# Patient Record
Sex: Male | Born: 1945 | Race: White | Hispanic: No | Marital: Married | Smoking: Former smoker
Health system: Southern US, Community
[De-identification: ages and names within clinical notes are randomized; demographics above are authoritative.]

## PROBLEM LIST (undated history)

## (undated) DIAGNOSIS — M19039 Primary osteoarthritis, unspecified wrist: Secondary | ICD-10-CM

## (undated) DIAGNOSIS — G8929 Other chronic pain: Secondary | ICD-10-CM

## (undated) DIAGNOSIS — D649 Anemia, unspecified: Secondary | ICD-10-CM

## (undated) DIAGNOSIS — F32A Depression, unspecified: Secondary | ICD-10-CM

## (undated) DIAGNOSIS — IMO0002 Reserved for concepts with insufficient information to code with codable children: Secondary | ICD-10-CM

## (undated) DIAGNOSIS — E785 Hyperlipidemia, unspecified: Secondary | ICD-10-CM

## (undated) DIAGNOSIS — N200 Calculus of kidney: Secondary | ICD-10-CM

## (undated) DIAGNOSIS — E669 Obesity, unspecified: Secondary | ICD-10-CM

## (undated) DIAGNOSIS — M549 Dorsalgia, unspecified: Secondary | ICD-10-CM

## (undated) DIAGNOSIS — G4730 Sleep apnea, unspecified: Secondary | ICD-10-CM

## (undated) DIAGNOSIS — D369 Benign neoplasm, unspecified site: Secondary | ICD-10-CM

## (undated) DIAGNOSIS — K648 Other hemorrhoids: Secondary | ICD-10-CM

## (undated) DIAGNOSIS — E079 Disorder of thyroid, unspecified: Secondary | ICD-10-CM

## (undated) DIAGNOSIS — F329 Major depressive disorder, single episode, unspecified: Secondary | ICD-10-CM

## (undated) DIAGNOSIS — K922 Gastrointestinal hemorrhage, unspecified: Secondary | ICD-10-CM

## (undated) HISTORY — DX: Benign neoplasm, unspecified site: D36.9

## (undated) HISTORY — DX: Major depressive disorder, single episode, unspecified: F32.9

## (undated) HISTORY — DX: Other hemorrhoids: K64.8

## (undated) HISTORY — DX: Reserved for concepts with insufficient information to code with codable children: IMO0002

## (undated) HISTORY — DX: Primary osteoarthritis, unspecified wrist: M19.039

## (undated) HISTORY — PX: ABDOMINAL SURGERY: SHX537

## (undated) HISTORY — DX: Anemia, unspecified: D64.9

## (undated) HISTORY — PX: GASTRIC BYPASS: SHX52

## (undated) HISTORY — DX: Obesity, unspecified: E66.9

## (undated) HISTORY — DX: Depression, unspecified: F32.A

## (undated) HISTORY — PX: ROTATOR CUFF REPAIR: SHX139

---

## 2005-05-27 ENCOUNTER — Observation Stay (HOSPITAL_COMMUNITY): Admission: RE | Admit: 2005-05-27 | Discharge: 2005-05-28 | Payer: Self-pay | Admitting: Specialist

## 2011-11-28 DIAGNOSIS — M48061 Spinal stenosis, lumbar region without neurogenic claudication: Secondary | ICD-10-CM | POA: Diagnosis not present

## 2011-12-14 DIAGNOSIS — K591 Functional diarrhea: Secondary | ICD-10-CM | POA: Diagnosis not present

## 2011-12-15 DIAGNOSIS — R109 Unspecified abdominal pain: Secondary | ICD-10-CM | POA: Diagnosis not present

## 2011-12-15 DIAGNOSIS — I959 Hypotension, unspecified: Secondary | ICD-10-CM | POA: Diagnosis not present

## 2011-12-15 DIAGNOSIS — K284 Chronic or unspecified gastrojejunal ulcer with hemorrhage: Secondary | ICD-10-CM | POA: Diagnosis not present

## 2011-12-15 DIAGNOSIS — K921 Melena: Secondary | ICD-10-CM | POA: Diagnosis not present

## 2011-12-15 DIAGNOSIS — K922 Gastrointestinal hemorrhage, unspecified: Secondary | ICD-10-CM | POA: Diagnosis not present

## 2011-12-15 DIAGNOSIS — M109 Gout, unspecified: Secondary | ICD-10-CM | POA: Diagnosis not present

## 2011-12-15 DIAGNOSIS — E785 Hyperlipidemia, unspecified: Secondary | ICD-10-CM | POA: Diagnosis not present

## 2011-12-15 DIAGNOSIS — D72829 Elevated white blood cell count, unspecified: Secondary | ICD-10-CM | POA: Diagnosis not present

## 2011-12-15 DIAGNOSIS — D62 Acute posthemorrhagic anemia: Secondary | ICD-10-CM | POA: Diagnosis present

## 2011-12-15 DIAGNOSIS — Z79899 Other long term (current) drug therapy: Secondary | ICD-10-CM | POA: Diagnosis not present

## 2011-12-15 DIAGNOSIS — K253 Acute gastric ulcer without hemorrhage or perforation: Secondary | ICD-10-CM | POA: Diagnosis not present

## 2011-12-15 DIAGNOSIS — D510 Vitamin B12 deficiency anemia due to intrinsic factor deficiency: Secondary | ICD-10-CM | POA: Diagnosis not present

## 2011-12-15 DIAGNOSIS — IMO0001 Reserved for inherently not codable concepts without codable children: Secondary | ICD-10-CM | POA: Diagnosis not present

## 2011-12-15 DIAGNOSIS — E669 Obesity, unspecified: Secondary | ICD-10-CM | POA: Diagnosis not present

## 2011-12-15 DIAGNOSIS — D500 Iron deficiency anemia secondary to blood loss (chronic): Secondary | ICD-10-CM | POA: Diagnosis not present

## 2011-12-15 DIAGNOSIS — E119 Type 2 diabetes mellitus without complications: Secondary | ICD-10-CM | POA: Diagnosis not present

## 2011-12-15 DIAGNOSIS — K279 Peptic ulcer, site unspecified, unspecified as acute or chronic, without hemorrhage or perforation: Secondary | ICD-10-CM | POA: Diagnosis not present

## 2011-12-15 DIAGNOSIS — R5383 Other fatigue: Secondary | ICD-10-CM | POA: Diagnosis not present

## 2011-12-15 DIAGNOSIS — R609 Edema, unspecified: Secondary | ICD-10-CM | POA: Diagnosis present

## 2011-12-15 DIAGNOSIS — D649 Anemia, unspecified: Secondary | ICD-10-CM | POA: Diagnosis not present

## 2011-12-15 DIAGNOSIS — I951 Orthostatic hypotension: Secondary | ICD-10-CM | POA: Diagnosis not present

## 2011-12-15 DIAGNOSIS — K289 Gastrojejunal ulcer, unspecified as acute or chronic, without hemorrhage or perforation: Secondary | ICD-10-CM | POA: Diagnosis not present

## 2011-12-15 DIAGNOSIS — R031 Nonspecific low blood-pressure reading: Secondary | ICD-10-CM | POA: Diagnosis not present

## 2011-12-15 DIAGNOSIS — R5381 Other malaise: Secondary | ICD-10-CM | POA: Diagnosis not present

## 2011-12-15 DIAGNOSIS — R578 Other shock: Secondary | ICD-10-CM | POA: Diagnosis not present

## 2011-12-15 DIAGNOSIS — E039 Hypothyroidism, unspecified: Secondary | ICD-10-CM | POA: Diagnosis not present

## 2011-12-15 DIAGNOSIS — R55 Syncope and collapse: Secondary | ICD-10-CM | POA: Diagnosis not present

## 2011-12-15 DIAGNOSIS — E871 Hypo-osmolality and hyponatremia: Secondary | ICD-10-CM | POA: Diagnosis not present

## 2011-12-15 DIAGNOSIS — R404 Transient alteration of awareness: Secondary | ICD-10-CM | POA: Diagnosis not present

## 2011-12-15 DIAGNOSIS — G8929 Other chronic pain: Secondary | ICD-10-CM | POA: Diagnosis not present

## 2011-12-15 DIAGNOSIS — K2901 Acute gastritis with bleeding: Secondary | ICD-10-CM | POA: Diagnosis not present

## 2011-12-15 DIAGNOSIS — Z9884 Bariatric surgery status: Secondary | ICD-10-CM | POA: Diagnosis not present

## 2011-12-21 DIAGNOSIS — D649 Anemia, unspecified: Secondary | ICD-10-CM | POA: Diagnosis not present

## 2011-12-21 DIAGNOSIS — K922 Gastrointestinal hemorrhage, unspecified: Secondary | ICD-10-CM | POA: Diagnosis not present

## 2011-12-21 DIAGNOSIS — R5381 Other malaise: Secondary | ICD-10-CM | POA: Diagnosis not present

## 2011-12-22 DIAGNOSIS — D62 Acute posthemorrhagic anemia: Secondary | ICD-10-CM | POA: Diagnosis not present

## 2011-12-22 DIAGNOSIS — R031 Nonspecific low blood-pressure reading: Secondary | ICD-10-CM | POA: Diagnosis not present

## 2011-12-22 DIAGNOSIS — K253 Acute gastric ulcer without hemorrhage or perforation: Secondary | ICD-10-CM | POA: Diagnosis not present

## 2011-12-22 DIAGNOSIS — K922 Gastrointestinal hemorrhage, unspecified: Secondary | ICD-10-CM | POA: Diagnosis not present

## 2011-12-22 DIAGNOSIS — E119 Type 2 diabetes mellitus without complications: Secondary | ICD-10-CM | POA: Diagnosis not present

## 2011-12-22 DIAGNOSIS — D649 Anemia, unspecified: Secondary | ICD-10-CM | POA: Diagnosis not present

## 2011-12-23 DIAGNOSIS — E785 Hyperlipidemia, unspecified: Secondary | ICD-10-CM | POA: Diagnosis not present

## 2011-12-23 DIAGNOSIS — D72829 Elevated white blood cell count, unspecified: Secondary | ICD-10-CM | POA: Diagnosis not present

## 2011-12-23 DIAGNOSIS — D510 Vitamin B12 deficiency anemia due to intrinsic factor deficiency: Secondary | ICD-10-CM | POA: Diagnosis not present

## 2011-12-23 DIAGNOSIS — E119 Type 2 diabetes mellitus without complications: Secondary | ICD-10-CM | POA: Diagnosis not present

## 2011-12-23 DIAGNOSIS — K648 Other hemorrhoids: Secondary | ICD-10-CM

## 2011-12-23 DIAGNOSIS — E871 Hypo-osmolality and hyponatremia: Secondary | ICD-10-CM | POA: Diagnosis not present

## 2011-12-23 DIAGNOSIS — R55 Syncope and collapse: Secondary | ICD-10-CM | POA: Diagnosis not present

## 2011-12-23 DIAGNOSIS — I959 Hypotension, unspecified: Secondary | ICD-10-CM | POA: Diagnosis not present

## 2011-12-23 DIAGNOSIS — K289 Gastrojejunal ulcer, unspecified as acute or chronic, without hemorrhage or perforation: Secondary | ICD-10-CM | POA: Diagnosis not present

## 2011-12-23 DIAGNOSIS — Z9884 Bariatric surgery status: Secondary | ICD-10-CM | POA: Diagnosis not present

## 2011-12-23 DIAGNOSIS — E039 Hypothyroidism, unspecified: Secondary | ICD-10-CM | POA: Diagnosis not present

## 2011-12-23 DIAGNOSIS — K922 Gastrointestinal hemorrhage, unspecified: Secondary | ICD-10-CM | POA: Diagnosis not present

## 2011-12-23 DIAGNOSIS — K921 Melena: Secondary | ICD-10-CM | POA: Diagnosis not present

## 2011-12-23 DIAGNOSIS — K284 Chronic or unspecified gastrojejunal ulcer with hemorrhage: Secondary | ICD-10-CM | POA: Diagnosis not present

## 2011-12-23 DIAGNOSIS — Z6831 Body mass index (BMI) 31.0-31.9, adult: Secondary | ICD-10-CM | POA: Diagnosis not present

## 2011-12-23 DIAGNOSIS — G8929 Other chronic pain: Secondary | ICD-10-CM | POA: Diagnosis present

## 2011-12-23 DIAGNOSIS — Z5189 Encounter for other specified aftercare: Secondary | ICD-10-CM | POA: Diagnosis not present

## 2011-12-23 DIAGNOSIS — M8440XA Pathological fracture, unspecified site, initial encounter for fracture: Secondary | ICD-10-CM | POA: Diagnosis not present

## 2011-12-23 DIAGNOSIS — F329 Major depressive disorder, single episode, unspecified: Secondary | ICD-10-CM | POA: Diagnosis not present

## 2011-12-23 DIAGNOSIS — E876 Hypokalemia: Secondary | ICD-10-CM | POA: Diagnosis not present

## 2011-12-23 DIAGNOSIS — Z85850 Personal history of malignant neoplasm of thyroid: Secondary | ICD-10-CM | POA: Diagnosis not present

## 2011-12-23 DIAGNOSIS — M109 Gout, unspecified: Secondary | ICD-10-CM | POA: Diagnosis not present

## 2011-12-23 DIAGNOSIS — D500 Iron deficiency anemia secondary to blood loss (chronic): Secondary | ICD-10-CM | POA: Diagnosis not present

## 2011-12-23 DIAGNOSIS — Z980 Intestinal bypass and anastomosis status: Secondary | ICD-10-CM | POA: Diagnosis not present

## 2011-12-23 DIAGNOSIS — D369 Benign neoplasm, unspecified site: Secondary | ICD-10-CM

## 2011-12-23 DIAGNOSIS — K279 Peptic ulcer, site unspecified, unspecified as acute or chronic, without hemorrhage or perforation: Secondary | ICD-10-CM | POA: Diagnosis not present

## 2011-12-23 DIAGNOSIS — D62 Acute posthemorrhagic anemia: Secondary | ICD-10-CM | POA: Diagnosis not present

## 2011-12-23 DIAGNOSIS — E1159 Type 2 diabetes mellitus with other circulatory complications: Secondary | ICD-10-CM | POA: Diagnosis not present

## 2011-12-23 DIAGNOSIS — K280 Acute gastrojejunal ulcer with hemorrhage: Secondary | ICD-10-CM | POA: Diagnosis not present

## 2011-12-23 HISTORY — DX: Benign neoplasm, unspecified site: D36.9

## 2011-12-23 HISTORY — DX: Other hemorrhoids: K64.8

## 2011-12-27 DIAGNOSIS — E785 Hyperlipidemia, unspecified: Secondary | ICD-10-CM | POA: Diagnosis present

## 2011-12-27 DIAGNOSIS — E119 Type 2 diabetes mellitus without complications: Secondary | ICD-10-CM | POA: Diagnosis not present

## 2011-12-27 DIAGNOSIS — Z6830 Body mass index (BMI) 30.0-30.9, adult: Secondary | ICD-10-CM | POA: Diagnosis not present

## 2011-12-27 DIAGNOSIS — D649 Anemia, unspecified: Secondary | ICD-10-CM | POA: Diagnosis not present

## 2011-12-27 DIAGNOSIS — E039 Hypothyroidism, unspecified: Secondary | ICD-10-CM | POA: Diagnosis not present

## 2011-12-27 DIAGNOSIS — Z791 Long term (current) use of non-steroidal anti-inflammatories (NSAID): Secondary | ICD-10-CM | POA: Diagnosis not present

## 2011-12-27 DIAGNOSIS — D62 Acute posthemorrhagic anemia: Secondary | ICD-10-CM | POA: Diagnosis present

## 2011-12-27 DIAGNOSIS — R5381 Other malaise: Secondary | ICD-10-CM | POA: Diagnosis present

## 2011-12-27 DIAGNOSIS — M109 Gout, unspecified: Secondary | ICD-10-CM | POA: Diagnosis not present

## 2011-12-27 DIAGNOSIS — E871 Hypo-osmolality and hyponatremia: Secondary | ICD-10-CM | POA: Diagnosis present

## 2011-12-27 DIAGNOSIS — E538 Deficiency of other specified B group vitamins: Secondary | ICD-10-CM | POA: Diagnosis present

## 2011-12-27 DIAGNOSIS — Z5189 Encounter for other specified aftercare: Secondary | ICD-10-CM | POA: Diagnosis not present

## 2011-12-27 DIAGNOSIS — E890 Postprocedural hypothyroidism: Secondary | ICD-10-CM | POA: Diagnosis present

## 2011-12-27 DIAGNOSIS — E669 Obesity, unspecified: Secondary | ICD-10-CM | POA: Diagnosis present

## 2011-12-27 DIAGNOSIS — R404 Transient alteration of awareness: Secondary | ICD-10-CM | POA: Diagnosis not present

## 2011-12-27 DIAGNOSIS — R578 Other shock: Secondary | ICD-10-CM | POA: Diagnosis not present

## 2011-12-27 DIAGNOSIS — M8448XA Pathological fracture, other site, initial encounter for fracture: Secondary | ICD-10-CM | POA: Diagnosis present

## 2011-12-27 DIAGNOSIS — I10 Essential (primary) hypertension: Secondary | ICD-10-CM | POA: Diagnosis not present

## 2011-12-27 DIAGNOSIS — F329 Major depressive disorder, single episode, unspecified: Secondary | ICD-10-CM | POA: Diagnosis not present

## 2011-12-27 DIAGNOSIS — K284 Chronic or unspecified gastrojejunal ulcer with hemorrhage: Secondary | ICD-10-CM | POA: Diagnosis present

## 2011-12-27 DIAGNOSIS — E079 Disorder of thyroid, unspecified: Secondary | ICD-10-CM | POA: Diagnosis not present

## 2011-12-27 DIAGNOSIS — K922 Gastrointestinal hemorrhage, unspecified: Secondary | ICD-10-CM | POA: Diagnosis not present

## 2011-12-27 DIAGNOSIS — R000 Tachycardia, unspecified: Secondary | ICD-10-CM | POA: Diagnosis present

## 2011-12-27 DIAGNOSIS — R42 Dizziness and giddiness: Secondary | ICD-10-CM | POA: Diagnosis not present

## 2011-12-27 DIAGNOSIS — Z9884 Bariatric surgery status: Secondary | ICD-10-CM | POA: Diagnosis not present

## 2011-12-27 DIAGNOSIS — Z85850 Personal history of malignant neoplasm of thyroid: Secondary | ICD-10-CM | POA: Diagnosis not present

## 2011-12-27 DIAGNOSIS — Z79899 Other long term (current) drug therapy: Secondary | ICD-10-CM | POA: Diagnosis not present

## 2011-12-27 DIAGNOSIS — Z87442 Personal history of urinary calculi: Secondary | ICD-10-CM | POA: Diagnosis not present

## 2011-12-27 DIAGNOSIS — E8809 Other disorders of plasma-protein metabolism, not elsewhere classified: Secondary | ICD-10-CM | POA: Diagnosis present

## 2011-12-27 DIAGNOSIS — G8929 Other chronic pain: Secondary | ICD-10-CM | POA: Diagnosis present

## 2011-12-28 ENCOUNTER — Inpatient Hospital Stay (HOSPITAL_COMMUNITY)
Admission: EM | Admit: 2011-12-28 | Discharge: 2011-12-30 | DRG: 377 | Disposition: A | Discharge status: Other Acute Inpatient Hospital | Payer: Medicare Other | Attending: Family Medicine | Admitting: Family Medicine

## 2011-12-28 ENCOUNTER — Encounter (HOSPITAL_COMMUNITY): Payer: Self-pay | Admitting: Emergency Medicine

## 2011-12-28 ENCOUNTER — Other Ambulatory Visit: Payer: Self-pay

## 2011-12-28 DIAGNOSIS — E871 Hypo-osmolality and hyponatremia: Secondary | ICD-10-CM | POA: Diagnosis present

## 2011-12-28 DIAGNOSIS — R42 Dizziness and giddiness: Secondary | ICD-10-CM | POA: Diagnosis not present

## 2011-12-28 DIAGNOSIS — E8809 Other disorders of plasma-protein metabolism, not elsewhere classified: Secondary | ICD-10-CM | POA: Diagnosis present

## 2011-12-28 DIAGNOSIS — K283 Acute gastrojejunal ulcer without hemorrhage or perforation: Secondary | ICD-10-CM

## 2011-12-28 DIAGNOSIS — R578 Other shock: Secondary | ICD-10-CM | POA: Diagnosis not present

## 2011-12-28 DIAGNOSIS — T394X5A Adverse effect of antirheumatics, not elsewhere classified, initial encounter: Secondary | ICD-10-CM | POA: Diagnosis present

## 2011-12-28 DIAGNOSIS — D649 Anemia, unspecified: Secondary | ICD-10-CM | POA: Diagnosis not present

## 2011-12-28 DIAGNOSIS — Z87442 Personal history of urinary calculi: Secondary | ICD-10-CM | POA: Diagnosis not present

## 2011-12-28 DIAGNOSIS — Z5189 Encounter for other specified aftercare: Secondary | ICD-10-CM | POA: Diagnosis not present

## 2011-12-28 DIAGNOSIS — R5381 Other malaise: Secondary | ICD-10-CM | POA: Diagnosis present

## 2011-12-28 DIAGNOSIS — Z79899 Other long term (current) drug therapy: Secondary | ICD-10-CM | POA: Diagnosis not present

## 2011-12-28 DIAGNOSIS — G8929 Other chronic pain: Secondary | ICD-10-CM | POA: Diagnosis present

## 2011-12-28 DIAGNOSIS — Z791 Long term (current) use of non-steroidal anti-inflammatories (NSAID): Secondary | ICD-10-CM | POA: Diagnosis not present

## 2011-12-28 DIAGNOSIS — K284 Chronic or unspecified gastrojejunal ulcer with hemorrhage: Secondary | ICD-10-CM | POA: Diagnosis present

## 2011-12-28 DIAGNOSIS — K289 Gastrojejunal ulcer, unspecified as acute or chronic, without hemorrhage or perforation: Secondary | ICD-10-CM | POA: Diagnosis not present

## 2011-12-28 DIAGNOSIS — Z9884 Bariatric surgery status: Secondary | ICD-10-CM

## 2011-12-28 DIAGNOSIS — Z6830 Body mass index (BMI) 30.0-30.9, adult: Secondary | ICD-10-CM

## 2011-12-28 DIAGNOSIS — E079 Disorder of thyroid, unspecified: Secondary | ICD-10-CM | POA: Diagnosis present

## 2011-12-28 DIAGNOSIS — E890 Postprocedural hypothyroidism: Secondary | ICD-10-CM | POA: Diagnosis present

## 2011-12-28 DIAGNOSIS — E785 Hyperlipidemia, unspecified: Secondary | ICD-10-CM | POA: Diagnosis present

## 2011-12-28 DIAGNOSIS — E119 Type 2 diabetes mellitus without complications: Secondary | ICD-10-CM | POA: Diagnosis not present

## 2011-12-28 DIAGNOSIS — I10 Essential (primary) hypertension: Secondary | ICD-10-CM | POA: Diagnosis not present

## 2011-12-28 DIAGNOSIS — M8448XA Pathological fracture, other site, initial encounter for fracture: Secondary | ICD-10-CM | POA: Diagnosis present

## 2011-12-28 DIAGNOSIS — E538 Deficiency of other specified B group vitamins: Secondary | ICD-10-CM | POA: Diagnosis present

## 2011-12-28 DIAGNOSIS — R000 Tachycardia, unspecified: Secondary | ICD-10-CM | POA: Diagnosis present

## 2011-12-28 DIAGNOSIS — F329 Major depressive disorder, single episode, unspecified: Secondary | ICD-10-CM | POA: Diagnosis not present

## 2011-12-28 DIAGNOSIS — E669 Obesity, unspecified: Secondary | ICD-10-CM | POA: Diagnosis present

## 2011-12-28 DIAGNOSIS — Z85850 Personal history of malignant neoplasm of thyroid: Secondary | ICD-10-CM | POA: Diagnosis not present

## 2011-12-28 DIAGNOSIS — R404 Transient alteration of awareness: Secondary | ICD-10-CM | POA: Diagnosis not present

## 2011-12-28 DIAGNOSIS — K922 Gastrointestinal hemorrhage, unspecified: Secondary | ICD-10-CM | POA: Diagnosis present

## 2011-12-28 DIAGNOSIS — D62 Acute posthemorrhagic anemia: Secondary | ICD-10-CM | POA: Diagnosis present

## 2011-12-28 DIAGNOSIS — E039 Hypothyroidism, unspecified: Secondary | ICD-10-CM | POA: Diagnosis not present

## 2011-12-28 HISTORY — DX: Hyperlipidemia, unspecified: E78.5

## 2011-12-28 HISTORY — DX: Dorsalgia, unspecified: M54.9

## 2011-12-28 HISTORY — DX: Sleep apnea, unspecified: G47.30

## 2011-12-28 HISTORY — DX: Calculus of kidney: N20.0

## 2011-12-28 HISTORY — DX: Disorder of thyroid, unspecified: E07.9

## 2011-12-28 HISTORY — DX: Other chronic pain: G89.29

## 2011-12-28 HISTORY — DX: Gastrointestinal hemorrhage, unspecified: K92.2

## 2011-12-28 LAB — CBC
Hemoglobin: 8 g/dL — ABNORMAL LOW (ref 13.0–17.0)
MCH: 30.2 pg (ref 26.0–34.0)
MCV: 85.3 fL (ref 78.0–100.0)
RBC: 2.65 MIL/uL — ABNORMAL LOW (ref 4.22–5.81)
RDW: 14.6 % (ref 11.5–15.5)

## 2011-12-28 LAB — DIFFERENTIAL
Eosinophils Relative: 1 % (ref 0–5)
Lymphocytes Relative: 16 % (ref 12–46)
Monocytes Absolute: 0.8 10*3/uL (ref 0.1–1.0)
Neutro Abs: 7.6 10*3/uL (ref 1.7–7.7)
Neutrophils Relative %: 75 % (ref 43–77)

## 2011-12-28 LAB — COMPREHENSIVE METABOLIC PANEL
AST: 19 U/L (ref 0–37)
Calcium: 8.2 mg/dL — ABNORMAL LOW (ref 8.4–10.5)
Chloride: 101 mEq/L (ref 96–112)
Creatinine, Ser: 0.98 mg/dL (ref 0.50–1.35)
GFR calc Af Amer: >90 mL/min (ref >90)
GFR calc non Af Amer: 84 mL/min — ABNORMAL LOW (ref >90)
Glucose, Bld: 160 mg/dL — ABNORMAL HIGH (ref 70–99)
Sodium: 132 mEq/L — ABNORMAL LOW (ref 135–145)
Total Bilirubin: 0.2 mg/dL — ABNORMAL LOW (ref 0.3–1.2)

## 2011-12-28 LAB — PROTIME-INR: INR: 1.02 (ref 0.00–1.49)

## 2011-12-28 LAB — APTT: aPTT: 27 seconds (ref 24–37)

## 2011-12-28 LAB — PREPARE RBC (CROSSMATCH)

## 2011-12-28 MED ORDER — SODIUM CHLORIDE 0.9 % IV SOLN
INTRAVENOUS | Status: DC
  Administered 2011-12-28: 18:00:00 via INTRAVENOUS

## 2011-12-28 MED ORDER — ONDANSETRON HCL 4 MG/2ML IJ SOLN: 4.0000 mg | Freq: Three times a day (TID) | INTRAMUSCULAR | Status: AC | PRN

## 2011-12-28 MED ORDER — SODIUM CHLORIDE 0.9 % IV SOLN
INTRAVENOUS | Status: AC
  Administered 2011-12-28: 23:00:00 via INTRAVENOUS

## 2011-12-28 MED ORDER — PANTOPRAZOLE SODIUM 40 MG IV SOLR
40.0000 mg | Freq: Once | INTRAVENOUS | Status: AC
  Administered 2011-12-28: 40 mg via INTRAVENOUS
  Filled 2011-12-28: qty 40

## 2011-12-28 MED ORDER — CYANOCOBALAMIN 1000 MCG/ML IJ SOLN
1000.0000 ug | Freq: Once | INTRAMUSCULAR | Status: AC
  Administered 2011-12-29: 1000 ug via INTRAMUSCULAR
  Filled 2011-12-28: qty 1

## 2011-12-28 MED ORDER — SODIUM CHLORIDE 0.9 % IV SOLN
8.0000 mg/h | INTRAVENOUS | Status: DC
  Administered 2011-12-28 – 2011-12-30 (×4): 8 mg/h via INTRAVENOUS
  Filled 2011-12-28 (×11): qty 80

## 2011-12-28 NOTE — ED Notes (Signed)
2 unsuccessful attempts at an IV start.

## 2011-12-28 NOTE — ED Notes (Signed)
Pt transported to floor by [employer]

## 2011-12-28 NOTE — H&P (Signed)
PCP:   No primary provider on file.  Doesn't have one per pt  Chief Complaint:  Dizziness, orthostasis  HPI: 65yoM with h/o remote thyroid ca s/p thyroidectomy, s/p gastric bypass ~2005, and chronic back  pain now presents with 3rd admission this month for UGIB and acute anemia, with prior EGD's  showing ulcers near gastric bypass anastamoses.   Pt and wife are quite accurate historians who state he was in usual state of health, including  chronic back pain for which he saw a pain clinic. He didn't want to take opioids, so instead  started taking Aleve, unclear dosage, but at least BID for the two weeks preceding 12/15/2011,  when he woke up 3a and had hemetemasis, bright red blood, without pain or promontory symptoms.   He was then admitted for the next week to [facility]. He had EGD x2 at [facility] (also  given Protonix, blood transfusions), and eventually discharged with Hgb 10.3. At home after  discharge, not 10 hrs later he had a vagal episode and syncopized, sent back to [place]. Then on  2/1 transferred to [facility] where Hgb eventually dropped to 8.3, given 1u PRBC's. On Sunday  they did an argon cauterization during EGD and monitored him, where Hgb stabilized at 9-10 by  discharge on Tuesday 2/5. He was transferred to [facility] where his wife works for short stay  rehab. They state he's had "20 blood transfusions" since all this started.   At [facility] last night, BP's started dropping to the 95's SBP, but they just monitored. He  was also orthostatic through the day Wednesday, but no LOC. Around 2p today also had a dark,  tarry looking stool but no hematemesis.   In the ED vital were stable, although currently on the low side 90-100's. Labs showed minimal  hypoNa 132, renal 27/0.98, low albumin 2.4, Hgb 8.0, normal plts and INR. Crossed for 1u PRBC's  and ordered for transfusion.   ROS as above o/w negative for chest pain, SOB, abd pain. He strongly denies  any alcohol and  states he quit smoking 30 yrs ago. There is no abd pain with all this. ROS o/w negative.   Past Medical History  Diagnosis Date  . Diabetes mellitus   . Thyroid disease     s/p thyroidectomy for thyroid cancer in 1971  . Hyperlipemia   . Nephrolithiasis   . Chronic back pain     compression fractures, 1 thoracic, 2 lumbar  . Upper GI bleed     Complicated course 11/2011 with EGD x3 showing ulcers at margins of gastric bypass    Past Surgical History  Procedure Date  . Gastric bypass   . Abdominal surgery     panniculectomy   . Rotator cuff repair     bilateral    Medications:  HOME MEDS: Reconciled  Prior to Admission medications   Medication Sig Start Date End Date Taking? Authorizing Provider  allopurinol (ZYLOPRIM) 150 mg TABS Take 150 mg by mouth daily.   Yes Historical Provider, MD  calcium-vitamin D (OSCAL WITH D) 500-200 MG-UNIT per tablet Take 1 tablet by mouth daily.   Yes Historical Provider, MD  CYANOCOBALAMIN IJ Inject 1,000 mcg/mL as directed every 30 (thirty) days.   Yes Historical Provider, MD  DULoxetine (CYMBALTA) 30 MG capsule Take 60 mg by mouth daily.    Yes Historical Provider, MD  Furosemide (LASIX PO) Take 5 mg by mouth daily.   Yes Historical Provider, MD  levothyroxine (  SYNTHROID, LEVOTHROID) 112 MCG tablet Take 162 mcg by mouth daily.    Yes Historical Provider, MD  metFORMIN (GLUCOPHAGE) 500 MG tablet Take 500 mg by mouth 2 (two) times daily with a meal.   Yes Historical Provider, MD  omeprazole (PRILOSEC) 40 MG capsule Take 40 mg by mouth 2 (two) times daily.   Yes Historical Provider, MD  potassium chloride (K-DUR) 10 MEQ tablet Take 10 mEq by mouth daily.   Yes Historical Provider, MD  pravastatin (PRAVACHOL) 20 MG tablet Take 20 mg by mouth daily.   Yes Historical Provider, MD  Prenatal Vit-Fe Fumarate-FA (MULTIVITAMIN-PRENATAL) 27-0.8 MG TABS Take 1 tablet by mouth daily.   Yes Historical Provider, MD  traMADol (ULTRAM) 50 MG  tablet Take 50 mg by mouth every 6 (six) hours as needed. pain   Yes Historical Provider, MD    Allergies:  No Known Allergies  Social History:   reports that he has quit smoking. He has never used smokeless tobacco. He reports that he does not drink alcohol or use illicit drugs. Wife Jasmine December [phone] works at [employer]. Pt still quite active before this recent hospitalization.   Family History: History reviewed. No pertinent family history.  Physical Exam: Filed Vitals:   12/28/11 1546 12/28/11 1615 12/28/11 1639 12/28/11 1959  BP:  111/56  103/62  Pulse:  95  91  Temp:   98.5 F (36.9 C) 99.1 F (37.3 C)  TempSrc:    Oral  Resp:  17  16  SpO2: 100% 100%  99%   Blood pressure 103/62, pulse 91, temperature 99.1 F (37.3 C), temperature source Oral, resp. rate 16, SpO2 99.00%.  Gen: Middle aged, overall healthy appearing M in no distress, pleasant, quite specific historian,  appears overall well.  HEENT: Pupils round and reactive, has small ecchymosis near eye, sclera/irises normal appearing.  Mouth monderately dry, but normal appearing Neck: Normal appearing but with anterior neck surgical scar Lungs: CTAB no w/c/r, normal exam, good air movement Heart: Regular, not tachycardic, no m/g, normal exam Abd: Soft, non tender, non distended, no facial grimacing to palpation, normal BS's, normal exam  overall Extrem: Warm hands, cool but not cold feet which are also quite pale. Palpable radials and DP's.  No BLE edema Neuro: Alert, attentive, CN2-12 intact, no facial droop or slurred speech, moves extremities on  own. Grossly non-focal Skin: Scattered bruises on his arms from prior blood draws   Labs & Imaging Results for orders placed during the hospital encounter of 12/28/11 (from the past 48 hour(s))  CBC     Status: Abnormal   Collection Time   12/28/11  4:55 PM      Component Value Range Comment   WBC 10.0  4.0 - 10.5 (K/uL)    RBC 2.65 (*) 4.22 - 5.81 (MIL/uL)     Hemoglobin 8.0 (*) 13.0 - 17.0 (g/dL)    HCT 86.5 (*) 78.4 - 52.0 (%)    MCV 85.3  78.0 - 100.0 (fL)    MCH 30.2  26.0 - 34.0 (pg)    MCHC 35.4  30.0 - 36.0 (g/dL)    RDW 69.6  29.5 - 28.4 (%)    Platelets 239  150 - 400 (K/uL)   DIFFERENTIAL     Status: Normal   Collection Time   12/28/11  4:55 PM      Component Value Range Comment   Neutrophils Relative 75  43 - 77 (%)    Neutro Abs 7.6  1.7 - 7.7 (K/uL)    Lymphocytes Relative 16  12 - 46 (%)    Lymphs Abs 1.6  0.7 - 4.0 (K/uL)    Monocytes Relative 8  3 - 12 (%)    Monocytes Absolute 0.8  0.1 - 1.0 (K/uL)    Eosinophils Relative 1  0 - 5 (%)    Eosinophils Absolute 0.1  0.0 - 0.7 (K/uL)    Basophils Relative 0  0 - 1 (%)    Basophils Absolute 0.0  0.0 - 0.1 (K/uL)   COMPREHENSIVE METABOLIC PANEL     Status: Abnormal   Collection Time   12/28/11  4:55 PM      Component Value Range Comment   Sodium 132 (*) 135 - 145 (mEq/L)    Potassium 4.1  3.5 - 5.1 (mEq/L)    Chloride 101  96 - 112 (mEq/L)    CO2 24  19 - 32 (mEq/L)    Glucose, Bld 160 (*) 70 - 99 (mg/dL)    BUN 27 (*) 6 - 23 (mg/dL)    Creatinine, Ser 1.61  0.50 - 1.35 (mg/dL)    Calcium 8.2 (*) 8.4 - 10.5 (mg/dL)    Total Protein 4.6 (*) 6.0 - 8.3 (g/dL)    Albumin 2.4 (*) 3.5 - 5.2 (g/dL)    AST 19  0 - 37 (U/L) SLIGHT HEMOLYSIS   ALT 15  0 - 53 (U/L)    Alkaline Phosphatase 65  39 - 117 (U/L)    Total Bilirubin 0.2 (*) 0.3 - 1.2 (mg/dL)    GFR calc non Af Amer 84 (*) >90 (mL/min)    GFR calc Af Amer >90  >90 (mL/min)   PROTIME-INR     Status: Normal   Collection Time   12/28/11  4:55 PM      Component Value Range Comment   Prothrombin Time 13.6  11.6 - 15.2 (seconds)    INR 1.02  0.00 - 1.49    APTT     Status: Normal   Collection Time   12/28/11  4:55 PM      Component Value Range Comment   aPTT 27  24 - 37 (seconds)   TYPE AND SCREEN     Status: Normal (Preliminary result)   Collection Time   12/28/11  4:55 PM      Component Value Range Comment   ABO/RH(D) A  POS      Antibody Screen NEG      Sample Expiration 12/31/2011      Unit Number [id number]      Blood Component Type RED CELLS,LR      Unit division 00      Status of Unit ALLOCATED      Transfusion Status OK TO TRANSFUSE      Crossmatch Result Compatible     ABO/RH     Status: Normal   Collection Time   12/28/11  4:55 PM      Component Value Range Comment   ABO/RH(D) A POS     PREPARE RBC (CROSSMATCH)     Status: Normal   Collection Time   12/28/11  6:30 PM      Component Value Range Comment   Order Confirmation ORDER PROCESSED BY BLOOD BANK      No results found.  Impression Present on Admission:  .Thyroid disease .Upper GI bleed .Hyperlipemia  65yoM with h/o remote thyroid ca s/p thyroidectomy, s/p gastric bypass ~2005, and chronic back  pain now presents with 3rd  admission this month for UGIB and acute anemia, with prior EGD's  showing ulcers near gastric bypass anastamoses.   1. Acute anemia, presumed recurrent UGIB: GI has been consulted, appreciated. Suspect marginal  persistent marginal ulceration near gastric bypass. Despite BP's in the 90-100's, currently pt  looks really quite well, so don't feel SDU needed at this time.   - Orthostatics on admit. Protonix drip. Tranfuse 1u PRBC's and trend q6-8. NPO, can try ice chips  as requested. Guaic all stools. Will likely need repeat EGD.   2. Diabetes: holding home Metformin, will give SSI while admitted 3. Remote thyroid ca s/p thyroidectomy: continue home levothyroxine  4. Psych: Continue home cymbalta  5. Hyperlipidemia: continue home statin 6. B12 deficiency: Pt requests monthly B12 shot while admitted, will order  Telemetry, [facility] team 3   Full code, discussed with pt   Other plans as per orders.  Kaylean Tupou 12/28/2011, 8:36 PM

## 2011-12-28 NOTE — ED Notes (Signed)
Per EMS- Patient is a resident of [facility]. Patient has had recent hospitalization at [facility] x 1 week and received blood transfusions. Patient then went to [facility] where he was released yesterday after having ulcers cauterized and received 1 unit PRBC's. Today patient is feeling weak and dizzy when he lays or stands.

## 2011-12-28 NOTE — ED Notes (Signed)
QMV:[id number]<NG> Expected date:12/28/11<BR> Expected time: 3:26 PM<BR> Means of arrival:Ambulance<BR> Comments:<BR> EMS 32 GC - gi bleed

## 2011-12-28 NOTE — ED Provider Notes (Signed)
History     CSN: [id number]  Arrival date & time 12/28/11  1602   First MD Initiated Contact with Patient 12/28/11 1619      Chief Complaint  Patient presents with  . GI Bleeding    (Consider location/radiation/quality/duration/timing/severity/associated sxs/prior treatment) Patient is a 66 y.o. male presenting with weakness. The history is provided by the patient.  Weakness The primary symptoms include dizziness. Primary symptoms do not include loss of consciousness, altered mental status, fever, nausea or vomiting. Primary symptoms comment: Patient discharged yesterday after a prolonged hospital stay after GI bleeding. January 26 time now patient has received 20 units of for upper GI bleeding. His last blood transfusion was 4 days ago. At the rehabilitation center today he attempted to get up The symptoms began yesterday. The symptoms are waxing and waning. The neurological symptoms are diffuse. The symptoms occurred after standing up and on exertion.  Dizziness also occurs with weakness. Dizziness does not occur with nausea or vomiting.  Additional symptoms include weakness. Additional symptoms do not include pain, leg pain, loss of balance or photophobia. Medical issues do not include hypertension. Associated medical issues comments: Recent endoscopy with cauterization of gastric ulcers.    Past Medical History  Diagnosis Date  . Blood transfusion   . Diabetes mellitus   . Thyroid disease   . Hyperlipemia   . Renal disorder   . Hypertension     Past Surgical History  Procedure Date  . Gastric bypass   . Abdominal surgery   . Rotator cuff repair     History reviewed. No pertinent family history.  History  Substance Use Topics  . Smoking status: Former Games developer  . Smokeless tobacco: Never Used  . Alcohol Use: No      Review of Systems  Constitutional: Negative for fever and chills.  Eyes: Negative for photophobia.  Gastrointestinal: Positive for blood in stool.  Negative for nausea, vomiting, abdominal pain and constipation.  Skin: Positive for pallor.  Neurological: Positive for dizziness and weakness. Negative for loss of consciousness and loss of balance.  Psychiatric/Behavioral: Negative for altered mental status.  All other systems reviewed and are negative.    Allergies  Review of patient's allergies indicates no known allergies.  Home Medications   Current Outpatient Rx  Name Route Sig Dispense Refill  . ALLOPURINOL 150 MG HALF TABLET Oral Take 150 mg by mouth daily.    Marland Kitchen CALCIUM CARBONATE-VITAMIN D 500-200 MG-UNIT PO TABS Oral Take 1 tablet by mouth daily.    . CYANOCOBALAMIN IJ Injection Inject 1,000 mcg/mL as directed every 30 (thirty) days.    . DULOXETINE HCL 30 MG PO CPEP Oral Take 30 mg by mouth daily.    Marland Kitchen LASIX PO Oral Take 5 mg by mouth daily.    Marland Kitchen LEVOTHYROXINE SODIUM 112 MCG PO TABS Oral Take 112 mcg by mouth daily.    Marland Kitchen METFORMIN HCL 500 MG PO TABS Oral Take 500 mg by mouth 2 (two) times daily with a meal.    . OMEPRAZOLE 40 MG PO CPDR Oral Take 40 mg by mouth 2 (two) times daily.    Marland Kitchen POTASSIUM CHLORIDE ER 10 MEQ PO TBCR Oral Take 10 mEq by mouth daily.    Marland Kitchen PRAVASTATIN SODIUM 20 MG PO TABS Oral Take 20 mg by mouth daily.    Marland Kitchen PRENATAL 27-0.8 MG PO TABS Oral Take 1 tablet by mouth daily.    . TRAMADOL HCL 50 MG PO TABS Oral Take 50 mg by  mouth every 6 (six) hours as needed. pain      BP 111/56  Pulse 95  Temp 98.5 F (36.9 C)  Resp 17  SpO2 100%  Physical Exam  Nursing note and vitals reviewed. Constitutional: He is oriented to person, place, and time. He appears well-developed and well-nourished. No distress.  HENT:  Head: Normocephalic and atraumatic.  Mouth/Throat: Oropharynx is clear and moist.  Eyes: Conjunctivae and EOM are normal. Pupils are equal, round, and reactive to light.  Neck: Normal range of motion. Neck supple.  Cardiovascular: Regular rhythm and intact distal pulses.  Tachycardia present.     No murmur heard. Pulmonary/Chest: Effort normal and breath sounds normal. No respiratory distress. He has no wheezes. He has no rales.  Abdominal: Soft. He exhibits no distension. There is no tenderness. There is no rebound and no guarding.  Musculoskeletal: Normal range of motion. He exhibits no edema and no tenderness.  Neurological: He is alert and oriented to person, place, and time.  Skin: Skin is warm and dry. No rash noted. No erythema. There is pallor.  Psychiatric: He has a normal mood and affect. His behavior is normal.    ED Course  Procedures (including critical care time)  Labs Reviewed  CBC - Abnormal; Notable for the following:    RBC 2.65 (*)    Hemoglobin 8.0 (*)    HCT 22.6 (*)    All other components within normal limits  COMPREHENSIVE METABOLIC PANEL - Abnormal; Notable for the following:    Sodium 132 (*)    Glucose, Bld 160 (*)    BUN 27 (*)    Calcium 8.2 (*)    Total Protein 4.6 (*)    Albumin 2.4 (*)    Total Bilirubin 0.2 (*)    GFR calc non Af Amer 84 (*)    All other components within normal limits  DIFFERENTIAL  PROTIME-INR  APTT  TYPE AND SCREEN  ABO/RH  PREPARE RBC (CROSSMATCH)   No results found.   Date: 12/28/2011  Rate: 89  Rhythm: normal sinus  QRS Axis: normal  Intervals: normal  ST/T Wave abnormalities: nonspecific ST changes  Conduction Disutrbances:none  Narrative Interpretation:   Old EKG Reviewed: unchanged  CRITICAL CARE Performed by: Gwyneth Sprout   Total critical care time: 30  Critical care time was exclusive of separately billable procedures and treating other patients.  Critical care was necessary to treat or prevent imminent or life-threatening deterioration.  Critical care was time spent personally by me on the following activities: development of treatment plan with patient and/or surrogate as well as nursing, discussions with consultants, evaluation of patient's response to treatment, examination of  patient, obtaining history from patient or surrogate, ordering and performing treatments and interventions, ordering and review of laboratory studies, ordering and review of radiographic studies, pulse oximetry and re-evaluation of patient's condition.   1. GI bleeding   2. Anemia       MDM   Patient with a history of recurrent upper GI bleeding. Was started on 12/17/2011 when he vomited bright red blood and was seen in an outside hospital. He is status post gastric bypass and was found to have bleeding ulcers in his stomach. Last week he was transferred to [facility] where he underwent cauterization of the ulcers by an argon gas. For 4 days his hemoglobin stabilized at 9 and greater and he was able to ambulate and was discharged to [facility] for rehabilitation. He arrived there last night and states  this morning started to feel very lightheaded and dizzy. He had a large tarry bowel movements which was his first one since being out of the hospital. She denies any vomiting of bright red blood at this time. He has no pain. On exam he is mildly tachycardic and pale. No murmur and normal breath sounds. He denies any shortness of breath or chest pain. Concern for ongoing bleeding. CBC, CMP, EKG pending.   Hemoglobin now 8 from 10 before he left. Type and screen done and patient given one unit of blood. CMP and EKG unchanged.  Spoke with GI and they will see the patient in the morning and he was started on a protonix drip.        Gwyneth Sprout, MD 12/28/11 [phone]

## 2011-12-28 NOTE — ED Notes (Signed)
Received report from [facility]

## 2011-12-29 ENCOUNTER — Inpatient Hospital Stay (HOSPITAL_COMMUNITY): Payer: Medicare Other

## 2011-12-29 ENCOUNTER — Encounter (HOSPITAL_COMMUNITY): Payer: Self-pay | Admitting: *Deleted

## 2011-12-29 ENCOUNTER — Encounter (HOSPITAL_COMMUNITY)
Admission: EM | Disposition: A | Discharge status: Other Acute Inpatient Hospital | Payer: Self-pay | Source: Home / Self Care | Attending: Family Medicine

## 2011-12-29 DIAGNOSIS — K283 Acute gastrojejunal ulcer without hemorrhage or perforation: Secondary | ICD-10-CM

## 2011-12-29 DIAGNOSIS — K922 Gastrointestinal hemorrhage, unspecified: Secondary | ICD-10-CM

## 2011-12-29 DIAGNOSIS — D649 Anemia, unspecified: Secondary | ICD-10-CM

## 2011-12-29 HISTORY — PX: ESOPHAGOGASTRODUODENOSCOPY: SHX5428

## 2011-12-29 LAB — CBC
HCT: 27.9 % — ABNORMAL LOW (ref 39.0–52.0)
Hemoglobin: 9.8 g/dL — ABNORMAL LOW (ref 13.0–17.0)
MCH: 29.4 pg (ref 26.0–34.0)
MCH: 29.5 pg (ref 26.0–34.0)
MCHC: 34.4 g/dL (ref 30.0–36.0)
MCV: 85.3 fL (ref 78.0–100.0)
MCV: 86.2 fL (ref 78.0–100.0)
Platelets: 203 10*3/uL (ref 150–400)
Platelets: 225 10*3/uL (ref 150–400)
RBC: 2.18 MIL/uL — ABNORMAL LOW (ref 4.22–5.81)
RDW: 14.5 % (ref 11.5–15.5)
WBC: 10.2 10*3/uL (ref 4.0–10.5)

## 2011-12-29 LAB — BASIC METABOLIC PANEL
CO2: 26 mEq/L (ref 19–32)
Calcium: 7.4 mg/dL — ABNORMAL LOW (ref 8.4–10.5)
Creatinine, Ser: 0.89 mg/dL (ref 0.50–1.35)
GFR calc non Af Amer: 88 mL/min — ABNORMAL LOW (ref >90)
Glucose, Bld: 138 mg/dL — ABNORMAL HIGH (ref 70–99)
Sodium: 139 mEq/L (ref 135–145)

## 2011-12-29 LAB — GLUCOSE, CAPILLARY

## 2011-12-29 LAB — MRSA PCR SCREENING: MRSA by PCR: NEGATIVE

## 2011-12-29 LAB — PREPARE RBC (CROSSMATCH)

## 2011-12-29 SURGERY — EGD (ESOPHAGOGASTRODUODENOSCOPY)
Anesthesia: Moderate Sedation

## 2011-12-29 MED ORDER — MIDAZOLAM HCL 10 MG/2ML IJ SOLN
INTRAMUSCULAR | Status: DC | PRN
  Administered 2011-12-29: 1 mg via INTRAVENOUS
  Administered 2011-12-29: 2 mg via INTRAVENOUS
  Administered 2011-12-29: 1 mg via INTRAVENOUS
  Administered 2011-12-29: 2 mg via INTRAVENOUS
  Administered 2011-12-29: 1 mg via INTRAVENOUS

## 2011-12-29 MED ORDER — LIDOCAINE HCL 1 % IJ SOLN
INTRAMUSCULAR | Status: AC
  Filled 2011-12-29: qty 20

## 2011-12-29 MED ORDER — ACETAMINOPHEN 325 MG PO TABS: 650.0000 mg | ORAL_TABLET | Freq: Four times a day (QID) | ORAL | Status: DC | PRN

## 2011-12-29 MED ORDER — NITROGLYCERIN 1 MG/10 ML FOR IR/CATH LAB
100.0000 ug | INTRA_ARTERIAL | Status: AC
  Administered 2011-12-29: 100 ug via INTRA_ARTERIAL

## 2011-12-29 MED ORDER — TRAZODONE 25 MG HALF TABLET
25.0000 mg | ORAL_TABLET | Freq: Every evening | ORAL | Status: DC | PRN
  Administered 2011-12-29: 25 mg via ORAL
  Filled 2011-12-29: qty 1

## 2011-12-29 MED ORDER — SODIUM CHLORIDE 0.9 % IV SOLN
INTRAVENOUS | Status: DC
  Administered 2011-12-29: 1000 mL via INTRAVENOUS
  Administered 2011-12-29: 06:00:00 via INTRAVENOUS

## 2011-12-29 MED ORDER — SODIUM CHLORIDE 0.9 % IJ SOLN
3.0000 mL | Freq: Two times a day (BID) | INTRAMUSCULAR | Status: DC
  Administered 2011-12-29 – 2011-12-30 (×2): 3 mL via INTRAVENOUS

## 2011-12-29 MED ORDER — SIMVASTATIN 10 MG PO TABS
10.0000 mg | ORAL_TABLET | Freq: Every day | ORAL | Status: DC
  Filled 2011-12-29: qty 1

## 2011-12-29 MED ORDER — ONDANSETRON HCL 4 MG/2ML IJ SOLN: 4.0000 mg | Freq: Four times a day (QID) | INTRAMUSCULAR | Status: DC | PRN

## 2011-12-29 MED ORDER — FENTANYL NICU IV SYRINGE 50 MCG/ML
INJECTION | INTRAMUSCULAR | Status: DC | PRN
  Administered 2011-12-29 (×3): 25 ug via INTRAVENOUS

## 2011-12-29 MED ORDER — MIDAZOLAM HCL 5 MG/5ML IJ SOLN
INTRAMUSCULAR | Status: AC | PRN
  Administered 2011-12-29: 1 mg via INTRAVENOUS

## 2011-12-29 MED ORDER — SODIUM CHLORIDE 0.9 % IJ SOLN
INTRAMUSCULAR | Status: DC | PRN
  Administered 2011-12-29: 13:00:00

## 2011-12-29 MED ORDER — NOREPINEPHRINE BITARTRATE 1 MG/ML IJ SOLN
2.0000 ug/min | INTRAMUSCULAR | Status: DC
  Administered 2011-12-29: 5.013 ug/min via INTRAVENOUS
  Filled 2011-12-29: qty 4

## 2011-12-29 MED ORDER — ACETAMINOPHEN 650 MG RE SUPP: 650.0000 mg | Freq: Four times a day (QID) | RECTAL | Status: DC | PRN

## 2011-12-29 MED ORDER — MIDAZOLAM HCL 10 MG/2ML IJ SOLN
INTRAMUSCULAR | Status: AC
  Filled 2011-12-29: qty 4

## 2011-12-29 MED ORDER — DULOXETINE HCL 60 MG PO CPEP
60.0000 mg | ORAL_CAPSULE | Freq: Every day | ORAL | Status: DC
  Filled 2011-12-29: qty 1

## 2011-12-29 MED ORDER — IOHEXOL 300 MG/ML  SOLN
100.0000 mL | Freq: Once | INTRAMUSCULAR | Status: AC | PRN
  Administered 2011-12-29: 100 mL via INTRA_ARTERIAL

## 2011-12-29 MED ORDER — EPINEPHRINE HCL 0.1 MG/ML IJ SOLN
INTRAMUSCULAR | Status: AC
  Filled 2011-12-29: qty 10

## 2011-12-29 MED ORDER — LEVOTHYROXINE SODIUM 25 MCG PO TABS
162.0000 ug | ORAL_TABLET | Freq: Every day | ORAL | Status: DC
  Filled 2011-12-29: qty 1

## 2011-12-29 MED ORDER — FENTANYL CITRATE 0.05 MG/ML IJ SOLN
INTRAMUSCULAR | Status: AC
  Filled 2011-12-29: qty 4

## 2011-12-29 MED ORDER — ONDANSETRON HCL 4 MG PO TABS: 4.0000 mg | ORAL_TABLET | Freq: Four times a day (QID) | ORAL | Status: DC | PRN

## 2011-12-29 MED ORDER — BUTAMBEN-TETRACAINE-BENZOCAINE 2-2-14 % EX AERO
INHALATION_SPRAY | CUTANEOUS | Status: DC | PRN
  Administered 2011-12-29: 2 via TOPICAL

## 2011-12-29 MED ORDER — FENTANYL CITRATE 0.05 MG/ML IJ SOLN
INTRAMUSCULAR | Status: AC | PRN
  Administered 2011-12-29: 50 ug via INTRAVENOUS

## 2011-12-29 NOTE — Progress Notes (Signed)
Bariatric Surgery Note:  66 YO WM who goes to the [place] for medical care.  He usually goes to [facility] and sees a Dr. Gaspar Cola.  He had a RYGB by Dr. Dianna Rossetti (Phone: [phone]) in 2007.  His weight went from 340 to about 240 pounds currently.  He had an panniculectomy at the [facility] in 2011.  He has had no trouble with his bypass and has not seen Dr. Sharol Harness since around 2008.  He has a chronic back injury/compression fxes since 1968 and is being seen at the pain clinic in [place] by a Dr. Wynona Canes.  He has had some injections and was given some OxyContin in early January 2013.  But he did not like the OxyContin and took Aleve for about 2 weeks starting in January.  The Aleve helped his back pain.  He had his first GI bleed on 12/15/2011.  He vomited BRB and went to [facility].  He was there until 12/21/2011.  He was discharged home, but on the same day, started bleeding again and went back to [facility]).  On 12/23/2011, he was transferred to [facility], and Dr. Phillips Grout (a surgeon) (Phone: [phone]).  In [place], Dr. Farrel Gobble (GI) endoscoped him and saw a marginal ulcer.  He did well enough to be discharged to [facility] (his wife works there) on 12/28/2011.  He then started bleeding yesterday and was brought to [facility].  Dr. Erick Blinks did an endoscopy and again saw a marginal ulcer with a visible vessel.  He injected it with epinephrine.  Then the patient had an arteriogram by Dr. Gardiner Coins, but he saw no active bleeding.  The patient is now in the ICU, stable, alert, and conversant.  His wife is at his side.  Assessment and Plan: I think that everyone is hoping that the ulcer will improve since it is probably related to the Aleve he took. But if he continues to bleed, he will need more definitive therapy. Certainly GI could endoscope him again.  But Dr. Rhea Belton described to me some limits as to how well he can approach the ulcer. The  other option is surgery (laparoscopic or open) to oversew or revise the gastrojejunostomy.  This will probably not be easy and carries the risks of leaking, infection, and recurrent bleeding. I also discussed possible transfer to Dr. Sharol Harness (since he did the original surgery) or back to [facility]. All this was discussed with the patient and his wife.

## 2011-12-29 NOTE — Progress Notes (Signed)
Patient developed hemorrhage in the endoscopy suite. He underwent emergent angiogram which was unrevealing. Subsequently transferred to the ICU. No bleeding in the ICU. His condition appears to have stabilized. He received a total of 4 units packed red blood cells today. One unit packed red blood cells last night. He has 2 units on hold and the blood Bank.  Plan CBC at 8 PM and then every 4 hours. Appreciate gastroenterology, interventional radiology and surgical evaluation and care.

## 2011-12-29 NOTE — Consult Note (Signed)
Patient seen, examined,  and I agree with the above documentation, including the assessment and plan. Very recent and extensive GI hx both at [facility] and [facility].  Apparently he had gastric ulcers, unfortunately, I do not have the report at present. I wonder if these are anastomotic ulcers given his gastric bypass history. He has continued to have melena and hematochezia with further drop in Hgb Getting 2 units now.  On ppi gtt Will repeat EGD to eval bleeding.  All efforts to obtain recent hospital records. Cycle Hgbs

## 2011-12-29 NOTE — Consult Note (Signed)
[facility] Consultation  Referring Provider: [facility] Primary Care Physician:  No primary provider on file. Primary Gastroenterologist:  Located in [place] Reason for Consultation: GI Bleed  HPI: Tracy Odom is a 66 y.o. male with a history of gastric bypass in 2005 who was recently hospitalized at [facility]  after an episode of hematemesis.  Per patient, while hospitalized at [facility] he was transfused several units of blood and underwent 2 EGDs. Upon discharge patient had a syncopal episode at home and was sent back to [place]. He was ultimately transferred to [facility] after hemoglobin fell a couple of grams. Patient reports that an EGD at [facility] showed non-bleeding gastric ulcers which were injected, possibly cauterized.  Patient had been taking NSAIDS for back pain a few weeks prior to GIB. He has been on a daily PPI longterm. Patient has continued to intermittent dark maroon stools since discharge from [facility]. He had one yesterday and none since. No abdominal pain or nausea.    Past Medical History  Diagnosis Date  . Diabetes mellitus   . Thyroid disease     s/p thyroidectomy for thyroid cancer in 1971  . Hyperlipemia   . Nephrolithiasis   . Chronic back pain     compression fractures, 1 thoracic, 2 lumbar  . Upper GI bleed     Complicated course 11/2011 with EGD x3 showing ulcers at margins of gastric bypass    Past Surgical History  Procedure Date  . Gastric bypass   . Abdominal surgery     panniculectomy   . Rotator cuff repair     bilateral    Prior to Admission medications   Medication Sig Start Date End Date Taking? Authorizing Provider  allopurinol (ZYLOPRIM) 150 mg TABS Take 150 mg by mouth daily.   Yes Historical Provider, MD  calcium-vitamin D (OSCAL WITH D) 500-200 MG-UNIT per tablet Take 1 tablet by mouth daily.   Yes Historical Provider, MD  CYANOCOBALAMIN IJ Inject 1,000 mcg/mL as directed every 30 (thirty) days.   Yes Historical  Provider, MD  DULoxetine (CYMBALTA) 30 MG capsule Take 60 mg by mouth daily.    Yes Historical Provider, MD  Furosemide (LASIX PO) Take 5 mg by mouth daily.   Yes Historical Provider, MD  levothyroxine (SYNTHROID, LEVOTHROID) 112 MCG tablet Take 162 mcg by mouth daily.    Yes Historical Provider, MD  metFORMIN (GLUCOPHAGE) 500 MG tablet Take 500 mg by mouth 2 (two) times daily with a meal.   Yes Historical Provider, MD  omeprazole (PRILOSEC) 40 MG capsule Take 40 mg by mouth 2 (two) times daily.   Yes Historical Provider, MD  potassium chloride (K-DUR) 10 MEQ tablet Take 10 mEq by mouth daily.   Yes Historical Provider, MD  pravastatin (PRAVACHOL) 20 MG tablet Take 20 mg by mouth daily.   Yes Historical Provider, MD  Prenatal Vit-Fe Fumarate-FA (MULTIVITAMIN-PRENATAL) 27-0.8 MG TABS Take 1 tablet by mouth daily.   Yes Historical Provider, MD  traMADol (ULTRAM) 50 MG tablet Take 50 mg by mouth every 6 (six) hours as needed. pain   Yes Historical Provider, MD    Current Facility-Administered Medications  Medication Dose Route Frequency Provider Last Rate Last Dose  . 0.9 %  sodium chloride infusion   Intravenous STAT Gwyneth Sprout, MD 75 mL/hr at 12/28/11 2305    . 0.9 %  sodium chloride infusion   Intravenous Continuous Carlota Raspberry, MD 100 mL/hr at 12/29/11 [phone]    . acetaminophen (TYLENOL) tablet  650 mg  650 mg Oral Q6H PRN Carlota Raspberry, MD       Or  . acetaminophen (TYLENOL) suppository 650 mg  650 mg Rectal Q6H PRN Carlota Raspberry, MD      . cyanocobalamin ((VITAMIN B-12)) injection 1,000 mcg  1,000 mcg Intramuscular Once Carlota Raspberry, MD   1,000 mcg at 12/29/11 0126  . ondansetron (ZOFRAN) injection 4 mg  4 mg Intravenous Q8H PRN Gwyneth Sprout, MD      . ondansetron (ZOFRAN) tablet 4 mg  4 mg Oral Q6H PRN Carlota Raspberry, MD       Or  . ondansetron [facility]) injection 4 mg  4 mg Intravenous Q6H PRN Carlota Raspberry, MD      . pantoprazole (PROTONIX) 80 mg in sodium chloride 0.9 % 250 mL infusion  8 mg/hr  Intravenous Continuous Gwyneth Sprout, MD 25 mL/hr at 12/29/11 0628 8 mg/hr at 12/29/11 0628  . pantoprazole (PROTONIX) injection 40 mg  40 mg Intravenous Once Gwyneth Sprout, MD   40 mg at 12/28/11 1843  . sodium chloride 0.9 % injection 3 mL  3 mL Intravenous Q12H Carlota Raspberry, MD   3 mL at 12/29/11 0147  . traZODone (DESYREL) tablet 25 mg  25 mg Oral QHS PRN Carlota Raspberry, MD   25 mg at 12/29/11 0144  . DISCONTD: 0.9 %  sodium chloride infusion   Intravenous Continuous Gwyneth Sprout, MD 125 mL/hr at 12/28/11 1824    . DISCONTD: DULoxetine (CYMBALTA) DR capsule 60 mg  60 mg Oral Daily Carlota Raspberry, MD      . DISCONTD: levothyroxine (SYNTHROID, LEVOTHROID) tablet 162 mcg  162 mcg Oral Daily Carlota Raspberry, MD      . DISCONTD: simvastatin (ZOCOR) tablet 10 mg  10 mg Oral q1800 Carlota Raspberry, MD        Allergies as of 12/28/2011  . (No Known Allergies)    History reviewed. No pertinent family history.  History   Social History  . Marital Status: Married    Spouse Name: N/A    Number of Children: N/A  . Years of Education: N/A   Occupational History  . Not on file.   Social History Main Topics  . Smoking status: Former Smoker -- 15 years    Quit date: 12/27/1980  . Smokeless tobacco: Never Used  . Alcohol Use: No  . Drug Use: No  . Sexually Active:    Other Topics Concern  . Not on file   Social History Narrative   Wife Jasmine December [phone] works at [employer]. Pt still quite active before this recent hospitalization.     Review of Systems: PHYSICAL EXAM: Vital signs in last 24 hours: Temp:  [97.9 F (36.6 C)-99.5 F (37.5 C)] 97.9 F (36.6 C) (02/07 0605) Pulse Rate:  [76-112] 76  (02/07 0605) Resp:  [12-18] 16  (02/07 0605) BP: (85-113)/(42-71) 107/65 mmHg (02/07 0605) SpO2:  [94 %-100 %] 94 % (02/07 0605) Weight:  [107.9 kg (237 lb 14 oz)] 107.9 kg (237 lb 14 oz) (02/06 2145) Last BM Date: 12/28/11 General:   Pleasant obese white male in NAD Head:  Normocephalic and  atraumatic. Eyes:   No icterus.   Conjunctiva pale. Bruising round right eye Ears:  Normal auditory acuity. Neck:  Supple; no masses felt Lungs:  Respirations even and unlabored. Lungs clear to auscultation bilaterally.   No wheezes, crackles, or rhonchi.  Heart:  Regular rate and rhythm Abdomen:  Soft, nondistended, nontender. Normal bowel sounds. No  appreciable masses or hepatomegaly.  Rectal:  Soft, maroon stool in vault.  Extremities:  Without edema. Neurologic:  Alert and  oriented;  grossly normal neurologically. Skin:  Intact without significant lesions or rashes. Cervical Nodes:  No significant cervical adenopathy. Psych:  Alert and cooperative. Normal affect.  LAB RESULTS:  Basename 12/29/11 0740 12/28/11 1655  WBC 7.2 10.0  HGB 6.4* 8.0*  HCT 18.6* 22.6*  PLT 203 239   BMET  Basename 12/29/11 0740 12/28/11 1655  NA 139 132*  K 3.6 4.1  CL 109 101  CO2 26 24  GLUCOSE 138* 160*  BUN 28* 27*  CREATININE 0.89 0.98  CALCIUM 7.4* 8.2*   LFT  Basename 12/28/11 1655  PROT 4.6*  ALBUMIN 2.4*  AST 19  ALT 15  ALKPHOS 65  BILITOT 0.2*  BILIDIR --  IBILI --   PT/INR  Basename 12/28/11 1655  LABPROT 13.6  INR 1.02    PREVIOUS ENDOSCOPIES: EGD (at least 3) mid Jan 2013 at [facility] and [facility] in [place] - obtaining records  Colonoscopy approximately 5 years ago in [place], normal per patient.  IMPRESSION / PLAN: 67. 66 year old white male with recurrent UGI bleed, probably secondary to gastric / anastomotic ulcers. Hemodynamically stable, last stool was yesterday (maroon). Continue PPI drip, EGD today for further evaluation and treatment. Records requested from [facility].  2. Anemia of acute blood loss, hemoglobin 6.4. Will transfuse 2 units PRBC stat. His hemoglobin around 10 on discharge from [facility]. He does take iron.  3. History of gastric bypass in 2005. On B12. 4. Chronic back pain. 5. Diabetes, on oral agents.  6. Remote history of thyroid cancer, s/p  thyroidectomy. On synthroid.    Thanks   LOS: 1 day   Willette Cluster  12/29/2011, 9:55 AM

## 2011-12-29 NOTE — Op Note (Signed)
[facility] [address] [place], [place]  [address]  ENDOSCOPY PROCEDURE REPORT  PATIENT:  Tracy Odom, Tracy Odom  MR#:  [id number] BIRTHDATE:  1946-03-11, 65 yrs. old  GENDER:  male ENDOSCOPIST:  Carie Caddy. William Laske, MD  PROCEDURE DATE:  12/29/2011 PROCEDURE:  EGD for control of bleeding ASA CLASS:  Class III INDICATIONS:  melena, hematochezia, anemia MEDICATIONS:  Fentanyl 75 mcg IV, versed 7 mg IV TOPICAL ANESTHETIC: cetacaine 2 sprays  DESCRIPTION OF PROCEDURE:   After the risks benefits and alternatives of the procedure were thoroughly explained, informed consent was obtained.  The Pentax Gastroscope [id number] endoscope was introduced through the mouth and advanced to the mid jejunum, limited by blood and clots.   The instrument was slowly withdrawn as the mucosa was fully examined. <<PROCEDUREIMAGES>>  The esophagus and gastroesophageal junction were completely normal in appearance. There was a very small inlet patch at 20 cm from the incisors. 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  An ulcer was found at the gastrojejunal anastomosis, more to the jejeunal side. There was a large visible vessel with no immediate active bleeding.  The efferent jejeunal limb was examined to the anastomosis with the afferent limb and was unremarkable.  The afferent limb was examined in retrograde fashion for a short distance with no evidence of bleeding/blood. Attempt made to place hemostatic clip on visible vessel resulting in active bleeding.  Epinephrine 1:10,000 injection performed which slowed bleeding but oozing remained.  Visualization was very difficult due to blood and clots.  Decision made to terminate the procedure.   Retroflexed views revealed not done.    The scope was then withdrawn from the patient and the procedure completed.  COMPLICATIONS:  None  ENDOSCOPIC IMPRESSION: 1) Normal esophagus.  Small inlet patch. 2) S/p roux-en/y gastric  bypass 3) Ulcer at the gastrojejunal anastomosis with large visible vessel.  Endoclip placed without control of bleeding.  Epinephrine injection performed. 4) Normal jejunum in the efferent and afferent limb.  Healthy appearing anastomosis at the afferent anastomosis.  RECOMMENDATIONS: 1) IR consult for angiography/control of bleeding 2) General surgery consult for assistance with control of bleeding. 3) Continue PPI infusion 4) Additional blood transfusion. 5) Move to ICU 6) Cycle Hgb/HCT  Carie Caddy. Rhea Belton, MD  n. Rosalie DoctorCarie Caddy. Kirra Verga at 12/29/2011 02:54 PM  Pearlean Brownie, [phone]

## 2011-12-29 NOTE — Progress Notes (Signed)
PT Cancellation Note  Evaluation cancelled today due to medical issues with patient which prohibited therapy.  Pt Hgb 6.4.  Maitlyn Penza,KATHrine E 12/29/2011, 8:31 AM Pager: [phone]

## 2011-12-29 NOTE — Progress Notes (Signed)
Patient seen and independently evaluated. I agree with exam, assessment and plan.

## 2011-12-29 NOTE — Progress Notes (Signed)
PATIENT DETAILS Name: Tracy Odom Age: 66 y.o. Sex: male Date of Birth: 1946-01-09 Admit Date: 12/28/2011 PCP: None (meds refilled at [facility]) POA:   CONSULTS: [facility]  Subjective: Feeling dizzy when sitting up. No BM since admit, but with melena prior to this admission. No hematochezia/hematemesis. Pt denies any chest pain or sob.   Objective: Vital signs in last 24 hours: Temp:  [97.9 F (36.6 C)-99.5 F (37.5 C)] 97.9 F (36.6 C) (02/07 0605) Pulse Rate:  [76-112] 76  (02/07 0605) Resp:  [12-18] 16  (02/07 0605) BP: (85-113)/(42-71) 107/65 mmHg (02/07 0605) SpO2:  [94 %-100 %] 94 % (02/07 0605) Weight:  [107.9 kg (237 lb 14 oz)] 107.9 kg (237 lb 14 oz) (02/06 2145) Weight change:  Last BM Date: 12/28/11  Intake/Output from previous day:  Intake/Output Summary (Last 24 hours) at 12/29/11 0925 Last data filed at 12/29/11 1610  Gross per 24 hour  Intake 1510.83 ml  Output    560 ml  Net 950.83 ml     Physical Exam:  Gen:  Awake, alert, pale-appearing but in NAD Cardiovascular:  S1S2 RRR, no m/r/g Respiratory: CTAB, no w/r/c, no increased wob Gastrointestinal: abdomen obese, soft, ND. No pain on palpation Neuro: no focal m/s deficits on exam Psych: AOx4, pleasant and cooperative Extremities: no c/c/e   Lab Results:  Lab 12/29/11 0740 12/28/11 1655  HGB 6.4* 8.0*  HCT 18.6* 22.6*  WBC 7.2 10.0  PLT 203 239     Lab 12/29/11 0740 12/28/11 1655  NA 139 132*  K 3.6 4.1  CL 109 101  CO2 26 24  GLUCOSE 138* 160*  BUN 28* 27*  CREATININE 0.89 0.98  CALCIUM 7.4* 8.2*  MG -- --  PHOS -- --    Studies/Results: No results found.  Medications: Scheduled Meds:   . sodium chloride   Intravenous STAT  . cyanocobalamin  1,000 mcg Intramuscular Once  . pantoprazole (PROTONIX) IV  40 mg Intravenous Once  . sodium chloride  3 mL Intravenous Q12H  . DISCONTD: DULoxetine  60 mg Oral Daily  . DISCONTD: levothyroxine  162 mcg Oral Daily  . DISCONTD:  simvastatin  10 mg Oral q1800   Continuous Infusions:   . sodium chloride 100 mL/hr at 12/29/11 0625  . pantoprozole (PROTONIX) infusion 8 mg/hr (12/29/11 0628)  . DISCONTD: sodium chloride 125 mL/hr at 12/28/11 1824   PRN Meds:.acetaminophen, acetaminophen, ondansetron (ZOFRAN) IV, ondansetron (ZOFRAN) IV, ondansetron, traZODone Antibiotics: Anti-infectives    None       Assessment/Plan:  1. GIB:  -recurrent admissions at [facility] and [facility] with d/c on 2/5. -presumed secondary to ulcerations at anastomosis site s/p gastric bypass (2005) per EGD findings at [facility] Plan: EGD per [facility] today. On PPI drip for now. NPO.  2. Anemia: -ABL secondary to above (suspect element of chronic dz also). S/p one unit PRBC this admission, but with drop in Hgb 6.4<--8.0.  Plan: recheck stat CBC to verify Hgb. Will likely need at least 2 more units PRBCS for now with low dose IV lasix between units to avoid volume overload. Serial h/h  3. Syncope in setting of hypotension: -secondary to above issues. SBP remains soft (90s-low 100's) and pt feels orthostatic. Plan: 250cc bolus now then reassess. Should improve with PRBC transfusion.  4. Hx DM2: -CBGs stable Plan: holding Metformin. Continue SSI.   5. Hx thyroid cancer s/p thyroidectomy: Plan: resume synthroid c po's  6. Hyperlipidemia: Plan: resume statin c  po's  7. Chronic back pain: Plan: NO MORE NSAIDS.   8. Hx Gastric bypass  9. Prophylaxis:  -no Lovenox with GIB. On PPI  Cordelia Pen, NP-C [facility]  pgr [phone]    LOS: 1 day    12/29/2011, 9:25 AM

## 2011-12-29 NOTE — Interval H&P Note (Cosign Needed)
History and Physical Interval Note:  12/29/2011 2:28 PM  Tracy Odom  Is scheduled for emergent mesenteric arteriogram with possible embolization today secondary to GI bleeding.  The various methods of treatment have been discussed with the patient and family. After consideration of risks, benefits and other options for treatment, the patient has consented to the above procedure:   The patients' history has been reviewed, patient examined, no change in status, stable for the above procedure.  I have reviewed the patients' chart and labs.  Questions were answered to the patient's satisfaction.  Consent signed by patient's wife secondary to recent sedation of patient.  Past Medical History  Diagnosis Date  . Diabetes mellitus   . Thyroid disease     s/p thyroidectomy for thyroid cancer in 1971  . Hyperlipemia   . Nephrolithiasis   . Chronic back pain     compression fractures, 1 thoracic, 2 lumbar  . Upper GI bleed     Complicated course 11/2011 with EGD x3 showing ulcers at margins of gastric bypass  . Sleep apnea     loss weight after gastric bypass syrgery   Past Surgical History  Procedure Date  . Gastric bypass   . Abdominal surgery     panniculectomy   . Rotator cuff repair     bilateral   Results for orders placed during the hospital encounter of 12/28/11  CBC      Component Value Range   WBC 10.0  4.0 - 10.5 (K/uL)   RBC 2.65 (*) 4.22 - 5.81 (MIL/uL)   Hemoglobin 8.0 (*) 13.0 - 17.0 (g/dL)   HCT 16.1 (*) 09.6 - 52.0 (%)   MCV 85.3  78.0 - 100.0 (fL)   MCH 30.2  26.0 - 34.0 (pg)   MCHC 35.4  30.0 - 36.0 (g/dL)   RDW 04.5  40.9 - 81.1 (%)   Platelets 239  150 - 400 (K/uL)  DIFFERENTIAL      Component Value Range   Neutrophils Relative 75  43 - 77 (%)   Neutro Abs 7.6  1.7 - 7.7 (K/uL)   Lymphocytes Relative 16  12 - 46 (%)   Lymphs Abs 1.6  0.7 - 4.0 (K/uL)   Monocytes Relative 8  3 - 12 (%)   Monocytes Absolute 0.8  0.1 - 1.0 (K/uL)   Eosinophils Relative 1  0 - 5  (%)   Eosinophils Absolute 0.1  0.0 - 0.7 (K/uL)   Basophils Relative 0  0 - 1 (%)   Basophils Absolute 0.0  0.0 - 0.1 (K/uL)  COMPREHENSIVE METABOLIC PANEL      Component Value Range   Sodium 132 (*) 135 - 145 (mEq/L)   Potassium 4.1  3.5 - 5.1 (mEq/L)   Chloride 101  96 - 112 (mEq/L)   CO2 24  19 - 32 (mEq/L)   Glucose, Bld 160 (*) 70 - 99 (mg/dL)   BUN 27 (*) 6 - 23 (mg/dL)   Creatinine, Ser 9.14  0.50 - 1.35 (mg/dL)   Calcium 8.2 (*) 8.4 - 10.5 (mg/dL)   Total Protein 4.6 (*) 6.0 - 8.3 (g/dL)   Albumin 2.4 (*) 3.5 - 5.2 (g/dL)   AST 19  0 - 37 (U/L)   ALT 15  0 - 53 (U/L)   Alkaline Phosphatase 65  39 - 117 (U/L)   Total Bilirubin 0.2 (*) 0.3 - 1.2 (mg/dL)   GFR calc non Af Amer 84 (*) >90 (mL/min)   GFR calc Af  Amer >90  >90 (mL/min)  PROTIME-INR      Component Value Range   Prothrombin Time 13.6  11.6 - 15.2 (seconds)   INR 1.02  0.00 - 1.49   APTT      Component Value Range   aPTT 27  24 - 37 (seconds)  TYPE AND SCREEN      Component Value Range   ABO/RH(D) A POS     Antibody Screen NEG     Sample Expiration 12/31/2011     Unit Number [id number]     Blood Component Type RED CELLS,LR     Unit division 00     Status of Unit ISSUED,FINAL     Transfusion Status OK TO TRANSFUSE     Crossmatch Result Compatible     Unit Number [id number]     Blood Component Type RED CELLS,LR     Unit division 00     Status of Unit ISSUED     Transfusion Status OK TO TRANSFUSE     Crossmatch Result Compatible     Unit Number [id number]     Blood Component Type RED CELLS,LR     Unit division 00     Status of Unit ISSUED     Transfusion Status OK TO TRANSFUSE     Crossmatch Result Compatible     Unit Number [id number]     Blood Component Type RED CELLS,LR     Unit division 00     Status of Unit ALLOCATED     Transfusion Status OK TO TRANSFUSE     Crossmatch Result Compatible     Unit Number [id number]     Blood Component Type RED CELLS,LR     Unit division 00     Status of Unit  ALLOCATED     Transfusion Status OK TO TRANSFUSE     Crossmatch Result Compatible     Unit Number [id number]     Blood Component Type RED CELLS,LR     Unit division 00     Status of Unit ALLOCATED     Transfusion Status OK TO TRANSFUSE     Crossmatch Result Compatible     Unit Number [id number]     Blood Component Type RED CELLS,LR     Unit division 00     Status of Unit ALLOCATED     Transfusion Status OK TO TRANSFUSE     Crossmatch Result Compatible    ABO/RH      Component Value Range   ABO/RH(D) A POS    PREPARE RBC (CROSSMATCH)      Component Value Range   Order Confirmation ORDER PROCESSED BY BLOOD BANK    CBC      Component Value Range   WBC 7.2  4.0 - 10.5 (K/uL)   RBC 2.18 (*) 4.22 - 5.81 (MIL/uL)   Hemoglobin 6.4 (*) 13.0 - 17.0 (g/dL)   HCT 44.0 (*) 34.7 - 52.0 (%)   MCV 85.3  78.0 - 100.0 (fL)   MCH 29.4  26.0 - 34.0 (pg)   MCHC 34.4  30.0 - 36.0 (g/dL)   RDW 42.5  95.6 - 38.7 (%)   Platelets 203  150 - 400 (K/uL)  BASIC METABOLIC PANEL      Component Value Range   Sodium 139  135 - 145 (mEq/L)   Potassium 3.6  3.5 - 5.1 (mEq/L)   Chloride 109  96 - 112 (mEq/L)   CO2 26  19 - 32 (mEq/L)   Glucose, Bld 138 (*)  70 - 99 (mg/dL)   BUN 28 (*) 6 - 23 (mg/dL)   Creatinine, Ser 1.61  0.50 - 1.35 (mg/dL)   Calcium 7.4 (*) 8.4 - 10.5 (mg/dL)   GFR calc non Af Amer 88 (*) >90 (mL/min)   GFR calc Af Amer >90  >90 (mL/min)  CBC      Component Value Range   WBC 7.1  4.0 - 10.5 (K/uL)   RBC 2.17 (*) 4.22 - 5.81 (MIL/uL)   Hemoglobin 6.4 (*) 13.0 - 17.0 (g/dL)   HCT 09.6 (*) 04.5 - 52.0 (%)   MCV 86.2  78.0 - 100.0 (fL)   MCH 29.5  26.0 - 34.0 (pg)   MCHC 34.2  30.0 - 36.0 (g/dL)   RDW 40.9  81.1 - 91.4 (%)   Platelets 225  150 - 400 (K/uL)  PREPARE RBC (CROSSMATCH)      Component Value Range   Order Confirmation ORDER PROCESSED BY BLOOD BANK    PREPARE RBC (CROSSMATCH)      Component Value Range   Order Confirmation ORDER PROCESSED BY BLOOD BANK    GLUCOSE,  CAPILLARY      Component Value Range   Glucose-Capillary 193 (*) 70 - 99 (mg/dL)   Comment 1 Documented in Chart      ALLRED,D [facility]

## 2011-12-29 NOTE — Progress Notes (Signed)
   CARE MANAGEMENT NOTE 12/29/2011  Patient:  Tracy Odom, Tracy Odom   Account Number:  [id number]  Date Initiated:  12/29/2011  Documentation initiated by:  Lanier Clam  Subjective/Objective Assessment:   ADMITTED W/DIZZINESS,ORTHOSTASIS.UGIB.     Action/Plan:   FROM HOME W/SPOUSE   Anticipated DC Date:  01/02/2012   Anticipated DC Plan:  HOME/SELF CARE         Choice offered to / List presented to:             Status of service:  In process, will continue to follow Medicare Important Message given?   (If response is "NO", the following Medicare IM given date fields will be blank) Date Medicare IM given:   Date Additional Medicare IM given:    Discharge Disposition:    Per UR Regulation:  Reviewed for med. necessity/level of care/duration of stay  Comments:  12/29/11 [facility] RN,BSN NCM [phone]

## 2011-12-29 NOTE — Consult Note (Signed)
Agree with above.  Unfortunately, all of his care was in [place] prior to this point.  It appears that he has a bleeding ulcer at his G-J anastomosis.  We will attempt embolization first, but he might require surgical intervention if unable to stop the bleeding.  Dr. Ezzard Standing is on call tonight and is aware of the situation.  Wilmon Arms. [name], MD, [facility]  12/29/2011 5:28 PM

## 2011-12-29 NOTE — Consult Note (Signed)
Reason for Consult:GI bleed Referring Physician: Dr. Marlowe Sax is an 66 y.o. male.  HPI: This is a 66 year old gentleman presented to [facility] with active hematemesis, 12/15/2011 and melena. He received 9 units of packed cells and EGD on 12/19/2011 showed a gastrojejunal anastomosis ulcer with no active bleeding. He was readmitted 12/20/2010 with syncope and melena. He received 13 more units of packed cells at that time. He was ultimately transferred to [facility] in [place] on 12/23/2011. He underwent repeat EGD  12/24/11, and argon plasma laser coagulation of the bleeding site at that facility. Along with epinephrine injection for hemostasis. He was discharged yesterday to [facility] rehabilitation. He was readmitted last night to [facility] with orthostasis and dizziness. He was found to have a hemoglobin of 8, and  hematocrit of 22.6 and recurrent GI Bleed.  He was transfused last last evening, with his hemoglobin dropping to 6.4 this AM.  He was seen by Dr. Erick Blinks, and take to endoscopy. Endoscopy revealed a normal esophagus and esophagogastric junction. There is a small inlet patch 20 cm from the incisors. Gastric pouch was entered revealing a Roux and Y. gastric bypass anatomy. Ulcer was found at gastric and J. general anastomosis more to the jejunal side. There is a large visible vessel with no immediate active bleeding. The efferent jejunal limb was examined to the anastomosis with the afferent limb and this was unremarkable the apparent lung was examined retrograde fashion for short distance with no evidence of bleeding or blood. A hemostatic clip placed on the visible vessel resulting in more bleeding. Epinephrine was injected and slowed the bleeding but oozing continued.Visualization became difficult secondary to blood and clots. The procedure was discontinued. The patient is currently receiving blood on low-dose Levophed and is hemodynamically  stable. He is being prepared to go to interventional radiology for possible embolization. We were asked in consultation. Dr. [name] has reviewed the studies, and discussed this with Dr. Ezzard Standing. Dr. Ezzard Standing is also discussed with Dr. Rhea Belton. Patient has blood typed and crossed and is receiving blood. And we will be available and follow with you.          Past Medical History  Diagnosis Date  . Diabetes mellitus   . Thyroid disease     s/p thyroidectomy for thyroid cancer in 1971  . Hyperlipemia   . Nephrolithiasis   . Chronic back pain     compression fractures, 1 thoracic, 2 lumbar  . Upper GI bleed     Complicated course 11/2011 with EGD x3 showing ulcers at margins of gastric bypass  . Sleep apnea     loss weight after gastric bypass syrgery  BMI 29.5  Past Surgical History  Procedure Date  . Gastric bypass   . Abdominal surgery     panniculectomy   . Rotator cuff repair     bilateral    Family History  Problem Relation Age of Onset  . Malignant hyperthermia Neg Hx     Social History:  reports that he quit smoking about 31 years ago. He has never used smokeless tobacco. He reports that he does not drink alcohol or use illicit drugs.  Allergies: No Known Allergies  Medications:  Prior to Admission:  Prescriptions prior to admission  Medication Sig Dispense Refill  . allopurinol (ZYLOPRIM) 150 mg TABS Take 150 mg by mouth daily.      . calcium-vitamin D (OSCAL WITH D) 500-200 MG-UNIT per tablet Take 1 tablet  by mouth daily.      . CYANOCOBALAMIN IJ Inject 1,000 mcg/mL as directed every 30 (thirty) days.      . DULoxetine (CYMBALTA) 30 MG capsule Take 60 mg by mouth daily.       . Furosemide (LASIX PO) Take 5 mg by mouth daily.      Marland Kitchen levothyroxine (SYNTHROID, LEVOTHROID) 112 MCG tablet Take 162 mcg by mouth daily.       . metFORMIN (GLUCOPHAGE) 500 MG tablet Take 500 mg by mouth 2 (two) times daily with a meal.      . omeprazole (PRILOSEC) 40 MG capsule Take 40 mg by  mouth 2 (two) times daily.      . potassium chloride (K-DUR) 10 MEQ tablet Take 10 mEq by mouth daily.      . pravastatin (PRAVACHOL) 20 MG tablet Take 20 mg by mouth daily.      . Prenatal Vit-Fe Fumarate-FA (MULTIVITAMIN-PRENATAL) 27-0.8 MG TABS Take 1 tablet by mouth daily.      . traMADol (ULTRAM) 50 MG tablet Take 50 mg by mouth every 6 (six) hours as needed. pain       Scheduled:   . sodium chloride   Intravenous STAT  . cyanocobalamin  1,000 mcg Intramuscular Once  . pantoprazole (PROTONIX) IV  40 mg Intravenous Once  . sodium chloride  3 mL Intravenous Q12H  . DISCONTD: DULoxetine  60 mg Oral Daily  . DISCONTD: levothyroxine  162 mcg Oral Daily  . DISCONTD: simvastatin  10 mg Oral q1800   Continuous:   . sodium chloride 1,000 mL (12/29/11 1408)  . norepinephrine (LEVOPHED) Adult infusion 2 mcg/min (12/29/11 1427)  . pantoprozole (PROTONIX) infusion 8 mg/hr (12/29/11 0628)  . DISCONTD: sodium chloride 125 mL/hr at 12/28/11 1824   XBM:[id number], acetaminophen, EPINEPHrine 1:10,000, 10 mL syringe/NS, 10 mL vial for sclerotherapy inj mixture, ondansetron (ZOFRAN) IV, ondansetron (ZOFRAN) IV, ondansetron, traZODone, DISCONTD: butamben-tetracaine-benzocaine, DISCONTD: fentaNYL, DISCONTD: midazolam  Results for orders placed during the hospital encounter of 12/28/11 (from the past 48 hour(s))  CBC     Status: Abnormal   Collection Time   12/28/11  4:55 PM      Component Value Range Comment   WBC 10.0  4.0 - 10.5 (K/uL)    RBC 2.65 (*) 4.22 - 5.81 (MIL/uL)    Hemoglobin 8.0 (*) 13.0 - 17.0 (g/dL)    HCT 53.6 (*) 64.4 - 52.0 (%)    MCV 85.3  78.0 - 100.0 (fL)    MCH 30.2  26.0 - 34.0 (pg)    MCHC 35.4  30.0 - 36.0 (g/dL)    RDW 03.4  74.2 - 59.5 (%)    Platelets 239  150 - 400 (K/uL)   DIFFERENTIAL     Status: Normal   Collection Time   12/28/11  4:55 PM      Component Value Range Comment   Neutrophils Relative 75  43 - 77 (%)    Neutro Abs 7.6  1.7 - 7.7 (K/uL)     Lymphocytes Relative 16  12 - 46 (%)    Lymphs Abs 1.6  0.7 - 4.0 (K/uL)    Monocytes Relative 8  3 - 12 (%)    Monocytes Absolute 0.8  0.1 - 1.0 (K/uL)    Eosinophils Relative 1  0 - 5 (%)    Eosinophils Absolute 0.1  0.0 - 0.7 (K/uL)    Basophils Relative 0  0 - 1 (%)    Basophils Absolute 0.0  0.0 - 0.1 (K/uL)   COMPREHENSIVE METABOLIC PANEL     Status: Abnormal   Collection Time   12/28/11  4:55 PM      Component Value Range Comment   Sodium 132 (*) 135 - 145 (mEq/L)    Potassium 4.1  3.5 - 5.1 (mEq/L)    Chloride 101  96 - 112 (mEq/L)    CO2 24  19 - 32 (mEq/L)    Glucose, Bld 160 (*) 70 - 99 (mg/dL)    BUN 27 (*) 6 - 23 (mg/dL)    Creatinine, Ser 1.61  0.50 - 1.35 (mg/dL)    Calcium 8.2 (*) 8.4 - 10.5 (mg/dL)    Total Protein 4.6 (*) 6.0 - 8.3 (g/dL)    Albumin 2.4 (*) 3.5 - 5.2 (g/dL)    AST 19  0 - 37 (U/L) SLIGHT HEMOLYSIS   ALT 15  0 - 53 (U/L)    Alkaline Phosphatase 65  39 - 117 (U/L)    Total Bilirubin 0.2 (*) 0.3 - 1.2 (mg/dL)    GFR calc non Af Amer 84 (*) >90 (mL/min)    GFR calc Af Amer >90  >90 (mL/min)   PROTIME-INR     Status: Normal   Collection Time   12/28/11  4:55 PM      Component Value Range Comment   Prothrombin Time 13.6  11.6 - 15.2 (seconds)    INR 1.02  0.00 - 1.49    APTT     Status: Normal   Collection Time   12/28/11  4:55 PM      Component Value Range Comment   aPTT 27  24 - 37 (seconds)   TYPE AND SCREEN     Status: Normal (Preliminary result)   Collection Time   12/28/11  4:55 PM      Component Value Range Comment   ABO/RH(D) A POS      Antibody Screen NEG      Sample Expiration 12/31/2011      Unit Number [id number]      Blood Component Type RED CELLS,LR      Unit division 00      Status of Unit ISSUED,FINAL      Transfusion Status OK TO TRANSFUSE      Crossmatch Result Compatible      Unit Number [id number]      Blood Component Type RED CELLS,LR      Unit division 00      Status of Unit ISSUED      Transfusion Status OK TO TRANSFUSE       Crossmatch Result Compatible      Unit Number [id number]      Blood Component Type RED CELLS,LR      Unit division 00      Status of Unit ISSUED      Transfusion Status OK TO TRANSFUSE      Crossmatch Result Compatible      Unit Number [id number]      Blood Component Type RED CELLS,LR      Unit division 00      Status of Unit ALLOCATED      Transfusion Status OK TO TRANSFUSE      Crossmatch Result Compatible      Unit Number [id number]      Blood Component Type RED CELLS,LR      Unit division 00      Status of Unit ALLOCATED      Transfusion Status OK TO TRANSFUSE  Crossmatch Result Compatible      Unit Number [id number]      Blood Component Type RED CELLS,LR      Unit division 00      Status of Unit ALLOCATED      Transfusion Status OK TO TRANSFUSE      Crossmatch Result Compatible      Unit Number [id number]      Blood Component Type RED CELLS,LR      Unit division 00      Status of Unit ALLOCATED      Transfusion Status OK TO TRANSFUSE      Crossmatch Result Compatible     ABO/RH     Status: Normal   Collection Time   12/28/11  4:55 PM      Component Value Range Comment   ABO/RH(D) A POS     PREPARE RBC (CROSSMATCH)     Status: Normal   Collection Time   12/28/11  6:30 PM      Component Value Range Comment   Order Confirmation ORDER PROCESSED BY BLOOD BANK     CBC     Status: Abnormal   Collection Time   12/29/11  7:40 AM      Component Value Range Comment   WBC 7.2  4.0 - 10.5 (K/uL)    RBC 2.18 (*) 4.22 - 5.81 (MIL/uL)    Hemoglobin 6.4 (*) 13.0 - 17.0 (g/dL)    HCT 56.2 (*) 13.0 - 52.0 (%)    MCV 85.3  78.0 - 100.0 (fL)    MCH 29.4  26.0 - 34.0 (pg)    MCHC 34.4  30.0 - 36.0 (g/dL)    RDW 86.5  78.4 - 69.6 (%)    Platelets 203  150 - 400 (K/uL)   BASIC METABOLIC PANEL     Status: Abnormal   Collection Time   12/29/11  7:40 AM      Component Value Range Comment   Sodium 139  135 - 145 (mEq/L)    Potassium 3.6  3.5 - 5.1 (mEq/L)    Chloride 109  96 - 112  (mEq/L)    CO2 26  19 - 32 (mEq/L)    Glucose, Bld 138 (*) 70 - 99 (mg/dL)    BUN 28 (*) 6 - 23 (mg/dL)    Creatinine, Ser 2.95  0.50 - 1.35 (mg/dL)    Calcium 7.4 (*) 8.4 - 10.5 (mg/dL)    GFR calc non Af Amer 88 (*) >90 (mL/min)    GFR calc Af Amer >90  >90 (mL/min)   CBC     Status: Abnormal   Collection Time   12/29/11  9:00 AM      Component Value Range Comment   WBC 7.1  4.0 - 10.5 (K/uL)    RBC 2.17 (*) 4.22 - 5.81 (MIL/uL)    Hemoglobin 6.4 (*) 13.0 - 17.0 (g/dL)    HCT 28.4 (*) 13.2 - 52.0 (%)    MCV 86.2  78.0 - 100.0 (fL)    MCH 29.5  26.0 - 34.0 (pg)    MCHC 34.2  30.0 - 36.0 (g/dL)    RDW 44.0  10.2 - 72.5 (%)    Platelets 225  150 - 400 (K/uL)   PREPARE RBC (CROSSMATCH)     Status: Normal   Collection Time   12/29/11 11:00 AM      Component Value Range Comment   Order Confirmation ORDER PROCESSED BY BLOOD BANK     GLUCOSE, CAPILLARY  Status: Abnormal   Collection Time   12/29/11  2:12 PM      Component Value Range Comment   Glucose-Capillary 193 (*) 70 - 99 (mg/dL)    Comment 1 Documented in Chart     PREPARE RBC (CROSSMATCH)     Status: Normal   Collection Time   12/29/11  2:30 PM      Component Value Range Comment   Order Confirmation ORDER PROCESSED BY BLOOD BANK       No results found.  Review of Systems  Constitutional: Positive for malaise/fatigue. Negative for fever, chills and weight loss.       Pt just discharged from [facility] after prolonged bleeding issue to rehab.He's been hospitalized since January . Tired and deconditioned just transferred to Rehab yesterday. He reports weakness and low BP.  HENT: Negative.   Eyes: Negative.   Respiratory: Negative.   Cardiovascular: Negative.   Gastrointestinal: Positive for blood in stool and melena. Negative for heartburn, nausea and vomiting.  Genitourinary: Negative.   Musculoskeletal: Negative.   Skin: Negative.   Neurological: Positive for weakness. Negative for dizziness, tingling, speech  change, focal weakness, seizures and loss of consciousness.  Endo/Heme/Allergies: Negative for environmental allergies. Bruises/bleeds easily.  Psychiatric/Behavioral: Positive for depression. Negative for suicidal ideas, hallucinations, memory loss and substance abuse. The patient is nervous/anxious. The patient does not have insomnia.    Blood pressure 110/57, pulse 84, temperature 98.1 F (36.7 C), temperature source Oral, resp. rate 14, height 6\' 3"  (1.905 m), weight 107.9 kg (237 lb 14 oz), SpO2 100.00%. Physical Exam  Vitals reviewed. Constitutional: He is oriented to person, place, and time. He appears well-developed.       Obese, BMI 29.5  HENT:  Head: Normocephalic.       [facility]  Eyes: Conjunctivae and EOM are normal. Pupils are equal, round, and reactive to light. Left eye exhibits no discharge. No scleral icterus.  Neck: Normal range of motion. Neck supple. No JVD present. No thyromegaly present.       Lg. Thyroidectomy scar base of his neck.  Cardiovascular: Regular rhythm, normal heart sounds and intact distal pulses.  Exam reveals no gallop.   No murmur heard.      ON levophed and receiving blood.  Respiratory: Effort normal and breath sounds normal. No respiratory distress. He has no wheezes. He has no rales. He exhibits no tenderness.  GI: Soft. Bowel sounds are normal. He exhibits no distension. There is no tenderness. There is no rebound and no guarding.       Scars from prior surg.  Musculoskeletal: He exhibits no edema.  Lymphadenopathy:    He has no cervical adenopathy.  Neurological: He is alert and oriented to person, place, and time. No cranial nerve deficit.  Skin: Skin is warm and dry. No rash noted. No erythema. There is pallor.  Psychiatric: He has a normal mood and affect. His behavior is normal. Judgment normal.       He's just finished EGd and had sedation but is pretty coherent.    Assessment/Plan: 1. Recurrent GI bleed  without ulcer the estrogen June anastomosis with a large vessel visible. Endo Clip placement for control of bleeding. Now awaiting interventional radiology. 2. History of Roux-en-Y gastric bypass 2007. 3. Status post panniculectomy 2011 4. Diabetes non-insulin-dependent 5. Hypothyroid status post thyroidectomy for thyroid cancer on supplements. 6. Chronic back pain; history of NSAID use. 7. Dyslipidemia 8. Nephrolithiasis with prior surgical  treatment 9. Mild depression 10.[place] Veteran. 11. Remote hx of tobacco use.  Plan: As noted above he's been seen by Dr. [name], Dr. Ezzard Standing is aware of the problem. He is going to interventional radiology for possible embolization. We will follow and be available as needed.  [facility] [phone]  12/29/2011 3:21 PM    Tracy Odom 12/29/2011, 2:27 PM

## 2011-12-29 NOTE — Procedures (Signed)
Procedure:  Selective celiac and SMA arteriography Findings:  Microcatheter runs performed in SMA branches supplying proximal jejunum at the level of the gastrojejunal anastamosis.  Pre and post NTG runs show no active bleed or abnormal vessel.  Multiple parallel vessels in this vicinity w/o distinct dominant vessel supplying region.  Celiac angio negative for bleeding vessel.  No embolization performed. Plan:  6 hour bedrest.

## 2011-12-30 ENCOUNTER — Encounter (HOSPITAL_COMMUNITY): Payer: Self-pay | Admitting: Internal Medicine

## 2011-12-30 DIAGNOSIS — K283 Acute gastrojejunal ulcer without hemorrhage or perforation: Secondary | ICD-10-CM

## 2011-12-30 DIAGNOSIS — I10 Essential (primary) hypertension: Secondary | ICD-10-CM | POA: Diagnosis not present

## 2011-12-30 DIAGNOSIS — E039 Hypothyroidism, unspecified: Secondary | ICD-10-CM | POA: Diagnosis present

## 2011-12-30 DIAGNOSIS — K644 Residual hemorrhoidal skin tags: Secondary | ICD-10-CM | POA: Diagnosis present

## 2011-12-30 DIAGNOSIS — D126 Benign neoplasm of colon, unspecified: Secondary | ICD-10-CM | POA: Diagnosis not present

## 2011-12-30 DIAGNOSIS — K921 Melena: Secondary | ICD-10-CM | POA: Diagnosis not present

## 2011-12-30 DIAGNOSIS — K5730 Diverticulosis of large intestine without perforation or abscess without bleeding: Secondary | ICD-10-CM | POA: Diagnosis not present

## 2011-12-30 DIAGNOSIS — Z9884 Bariatric surgery status: Secondary | ICD-10-CM | POA: Diagnosis not present

## 2011-12-30 DIAGNOSIS — K621 Rectal polyp: Secondary | ICD-10-CM | POA: Diagnosis not present

## 2011-12-30 DIAGNOSIS — M109 Gout, unspecified: Secondary | ICD-10-CM | POA: Diagnosis present

## 2011-12-30 DIAGNOSIS — E119 Type 2 diabetes mellitus without complications: Secondary | ICD-10-CM | POA: Diagnosis not present

## 2011-12-30 DIAGNOSIS — K284 Chronic or unspecified gastrojejunal ulcer with hemorrhage: Secondary | ICD-10-CM | POA: Diagnosis present

## 2011-12-30 DIAGNOSIS — D128 Benign neoplasm of rectum: Secondary | ICD-10-CM | POA: Diagnosis present

## 2011-12-30 DIAGNOSIS — K289 Gastrojejunal ulcer, unspecified as acute or chronic, without hemorrhage or perforation: Secondary | ICD-10-CM | POA: Diagnosis not present

## 2011-12-30 DIAGNOSIS — K620 Anal polyp: Secondary | ICD-10-CM | POA: Diagnosis not present

## 2011-12-30 DIAGNOSIS — K280 Acute gastrojejunal ulcer with hemorrhage: Secondary | ICD-10-CM | POA: Diagnosis not present

## 2011-12-30 DIAGNOSIS — K922 Gastrointestinal hemorrhage, unspecified: Secondary | ICD-10-CM | POA: Diagnosis not present

## 2011-12-30 DIAGNOSIS — D62 Acute posthemorrhagic anemia: Secondary | ICD-10-CM | POA: Diagnosis present

## 2011-12-30 DIAGNOSIS — D649 Anemia, unspecified: Secondary | ICD-10-CM | POA: Diagnosis not present

## 2011-12-30 LAB — CBC
HCT: 24.7 % — ABNORMAL LOW (ref 39.0–52.0)
HCT: 29.2 % — ABNORMAL LOW (ref 39.0–52.0)
Hemoglobin: 10.2 g/dL — ABNORMAL LOW (ref 13.0–17.0)
Hemoglobin: 8.6 g/dL — ABNORMAL LOW (ref 13.0–17.0)
Hemoglobin: 8.8 g/dL — ABNORMAL LOW (ref 13.0–17.0)
MCH: 29.8 pg (ref 26.0–34.0)
MCH: 30.2 pg (ref 26.0–34.0)
MCH: 30.4 pg (ref 26.0–34.0)
MCHC: 35.6 g/dL (ref 30.0–36.0)
MCHC: 34.9 g/dL (ref 30.0–36.0)
MCV: 85.5 fL (ref 78.0–100.0)
MCV: 85.5 fL (ref 78.0–100.0)
Platelets: 173 10*3/uL (ref 150–400)
Platelets: 185 10*3/uL (ref 150–400)
RBC: 2.89 MIL/uL — ABNORMAL LOW (ref 4.22–5.81)
RBC: 2.89 MIL/uL — ABNORMAL LOW (ref 4.22–5.81)
RBC: 3.38 MIL/uL — ABNORMAL LOW (ref 4.22–5.81)
RDW: 14.3 % (ref 11.5–15.5)
WBC: 8 10*3/uL (ref 4.0–10.5)
WBC: 8.3 10*3/uL (ref 4.0–10.5)

## 2011-12-30 LAB — GLUCOSE, CAPILLARY: Glucose-Capillary: 131 mg/dL — ABNORMAL HIGH (ref 70–99)

## 2011-12-30 MED ORDER — NON FORMULARY: 8.0000 mg | Status: DC

## 2011-12-30 MED ORDER — INSULIN ASPART 100 UNIT/ML ~~LOC~~ SOLN
0.0000 [IU] | Freq: Three times a day (TID) | SUBCUTANEOUS | Status: DC
  Administered 2011-12-30: 1 [IU] via SUBCUTANEOUS
  Filled 2011-12-30: qty 3

## 2011-12-30 NOTE — Progress Notes (Signed)
1 Day Post-Op  Subjective: Feels and looks much better this am. No hematemesis. Passing flatus but no BM. Denies pain  Objective: Vital signs in last 24 hours: Temp:  [97.9 F (36.6 C)-99.5 F (37.5 C)] 97.9 F (36.6 C) (02/08 0400) Pulse Rate:  [60-90] 74  (02/08 0700) Resp:  [6-78] 13  (02/08 0700) BP: (87-156)/(41-90) 129/51 mmHg (02/08 0700) SpO2:  [95 %-100 %] 99 % (02/08 0700) Weight:  [110.4 kg (243 lb 6.2 oz)-110.8 kg (244 lb 4.3 oz)] 110.4 kg (243 lb 6.2 oz) (02/08 0400) Last BM Date: 12/28/11  Intake/Output this shift:    Physical Exam: BP 129/51  Pulse 74  Temp(Src) 97.9 F (36.6 C) (Oral)  Resp 13  Ht 6\' 3"  (1.905 m)  Wt 110.4 kg (243 lb 6.2 oz)  BMI 30.42 kg/m2  SpO2 99% Abdomen: soft, ND, NT, no masses  Labs: CBC  Basename 12/30/11 0305 12/29/11 2355  WBC 8.0 8.3  HGB 8.8* 8.6*  HCT 24.7* 24.7*  PLT 173 185   BMET  Basename 12/29/11 0740 12/28/11 1655  NA 139 132*  K 3.6 4.1  CL 109 101  CO2 26 24  GLUCOSE 138* 160*  BUN 28* 27*  CREATININE 0.89 0.98  CALCIUM 7.4* 8.2*   LFT  Basename 12/28/11 1655  PROT 4.6*  ALBUMIN 2.4*  AST 19  ALT 15  ALKPHOS 65  BILITOT 0.2*  BILIDIR --  IBILI --  LIPASE --   PT/INR  Basename 12/28/11 1655  LABPROT 13.6  INR 1.02   ABG No results found for this basename: PHART:2,PCO2:2,PO2:2,HCO3:2 in the last 72 hours  Studies/Results: No results found.  Assessment: Principal Problem:  *Upper GI bleed Active Problems:  Thyroid disease  Hyperlipemia   Procedure(s): ESOPHAGOGASTRODUODENOSCOPY (EGD)  Plan: Hgb up some. He feels well this am. Dr. [name] and Ezzard Standing to discuss. Pt would prefer not to have to travel back to [place]. NPO for now. Protonix gtt, role for Carafate?  LOS: 2 days    Alyse Low 12/30/2011 7:49 AM

## 2011-12-30 NOTE — Discharge Summary (Signed)
Physician Discharge Summary  Tracy Odom ZOX:[id number] DOB: September 01, 1946 DOA: 12/28/2011  [facility]: [facility]   Admit date: 12/28/2011 Discharge date: 12/30/2011  This is a transfer summary. Facility: [facility], Claris Gower Accepting physician: Dr. Demetrios Isaacs Reason for transfer: GI bleed, no bariatric surgery backup available at current facility.  Condition on transfer: Stable.   Discharge Diagnoses:  1. Upper GI bleed secondary to gastrojejunal anastomosis 2. Hemorrhagic shock secondary to above 3. Acute blood loss anemia secondary to #1 4. Diabetes mellitus, stable 5. History bariatric surgery  History of present illness:  66 year old man presented with GI bleed. Complicated history of GI bleed over the last 6 weeks has been documented.  Hospital Course:  Mr. Noyce was initially admitted to the medical floor. This of his history of gastrojejunal ulcer he was seen by gastroenterology. He underwent endoscopy for further assessment and developed hemorrhagic shock. He was aggressively transfused and volume resuscitated. He underwent emergent arteriogram which showed no evidence of active bleeding and therefore no intervention was performed. He was subsequently transferred to the ICU where he has remained stable since. He has had no further bleeding from a 24-hour snow and hemoglobin has remained stable. Gen. surgery discussed the case with the patient's for a bariatric surgeon as well as with surgery at [facility]. Current facility does not have bariatric surgery coverage this weekend and therefore transferred as recommended by general surgery. The patient has accepting physician for discussion with general surgery who has kindly made all the arrangements and performed all discussions. Patient was discussed with Dr. Sharla Kidney who recommends continuing IV Protonix infusion on transfer but has no other specific recommendations. 1. Upper GI bleed: Secondary to ulcer at  gastrojejunal anastomosis. Status post total 5 unit packed red blood cells at this point. Hemoglobin stable. No recurrent bleeding since transfer to the ICU. Protonix infusion per GI.  2. Hemorrhagic shock: Resolved with transfusion and fluid resuscitation.  3. Acute blood loss anemia: As above. Continue to monitor CBC.  4. Diabetes mellitus: Stable. Sliding scale insulin.  5. Iatrogenic Hypothyroidism: Continue replacement therapy. Status post thyroidectomy for thyroid cancer 1971.  6. Chronic back pain: No NSAIDs now or in the future.  Consultants:  General surgery   Gastroenterology   Interventional radiology  Procedures:  February 8: Emergent mesenteric arteriogram: No bleeding identified. No intervention.   February 6: 1 unit packed red blood cell transfusion   February 7: 4 units packed red blood cell transfusion  Exam on transfer is documented in last progress note.  Discharge Instructions  Discharge Orders    Future Orders Please Complete By Expires   Discharge instructions      Comments:   Ice chips. Continue Protonix infusion during transit.   Bed rest        Medication List  As of 12/30/2011 12:52 PM   STOP taking these medications         allopurinol 150 mg Tabs      calcium-vitamin D 500-200 MG-UNIT per tablet      CYANOCOBALAMIN IJ      DULoxetine 30 MG capsule      LASIX PO      levothyroxine 112 MCG tablet      metFORMIN 500 MG tablet      multivitamin-prenatal 27-0.8 MG Tabs      omeprazole 40 MG capsule      potassium chloride 10 MEQ tablet      pravastatin 20 MG tablet      traMADol  50 MG tablet              The results of significant diagnostics from this hospitalization (including imaging, microbiology, ancillary and laboratory) are listed below for reference.    Significant Diagnostic Studies: Ir Angiogram Visceral Selective  12/30/2011  *RADIOLOGY REPORT*  Clinical Data: Status post prior gastric bypass surgery.  Prolonged  and active upper GI bleed with endoscopic confirmation of bleeding from a jejunal ulcer near the gastrojejunal anastomosis.  1.  SELECTIVE ARTERIOGRAPHY OF THE SUPERIOR MESENTERIC ARTERY INCLUDING FOURTH ORDER MICROCATHETER ARTERIOGRAPHY 2.  SELECTIVE ARTERIOGRAPHY OF THE CELIAC AXIS  Comparison:  Prior CT and bleeding scan from [facility]  Sedation: 1.0 mg IV Versed; 50 mcg IV Fentanyl.  Total Moderate Sedation Time: 50 minutes.  Contrast:  100 ml Omnipaque-300  Additional Medications: 100 mcg intra-arterial nitroglycerin  Fluoroscopy Time: 10.4 minutes.  Procedure:  The procedure, risks, benefits, and alternatives were explained to the patient.  Questions regarding the procedure were encouraged and answered.  The patient understands and consents to the procedure.  The right groin was prepped with betadine in a sterile fashion, and a sterile drape was applied covering the operative field.  A sterile gown and sterile gloves were used for the procedure. Local anesthesia was provided with 1% Lidocaine.  Access of the right common femoral artery was performed under ultrasound guidance with a micropuncture set.  Ultrasound image documentation was performed.  A 5-French sheath was placed.  A 5- [place] Cobra catheter was then advanced into the abdominal aorta.  The catheter was further advanced into the trunk of the SMA. Selective arteriography was performed.  A coaxial microcatheter was then placed and advanced into a fourth order jejunal branch. Selective arteriography was performed in several projections including post nitroglycerin administration run to provide vasodilatation.  The microcatheter was removed.  The celiac axis was catheterized with selective arteriography performed.  A second celiac run was also performed through a 5-French Sos catheter advanced into the celiac axis.  Complications: None  Findings: Superior mesenteric arteriography demonstrates normal branches supplying small bowel and proximal  colon.  Superiorly oriented branches are well delineated supplying the loop of jejunum which has been pulled up to the level of the stomach after gastric bypass.  The branches supplying the loop of jejunum were able to be further studied with selective microcatheter injection.  This demonstrates multiple small parallel branches extending up to the gastrojejunal anastomosis but no evidence of active bleeding or abnormal vessels. Given arteriographic appearance and anatomy, it was not possible to perform selective embolization.  Celiac arteriography shows normal branches including a small left gastric artery and left inferior phrenic artery.  There is no abnormal supply to the region of the stomach or jejunum.  IMPRESSION: No active bleeding source identified by arteriography of the superior mesenteric artery and celiac axis as above.  This included subselective catheterization of the jejunal branches supplying the loop of jejunum which has been brought up to the stomach for bypass.  Selective embolization was not possible.  Original Report Authenticated By: Reola Calkins, M.D.   Ir Angiogram Visceral Selective  12/30/2011  *RADIOLOGY REPORT*  Clinical Data: Status post prior gastric bypass surgery.  Prolonged and active upper GI bleed with endoscopic confirmation of bleeding from a jejunal ulcer near the gastrojejunal anastomosis.  1.  SELECTIVE ARTERIOGRAPHY OF THE SUPERIOR MESENTERIC ARTERY INCLUDING FOURTH ORDER MICROCATHETER ARTERIOGRAPHY 2.  SELECTIVE ARTERIOGRAPHY OF THE CELIAC AXIS  Comparison:  Prior CT and bleeding scan  from [facility]  Sedation: 1.0 mg IV Versed; 50 mcg IV Fentanyl.  Total Moderate Sedation Time: 50 minutes.  Contrast:  100 ml Omnipaque-300  Additional Medications: 100 mcg intra-arterial nitroglycerin  Fluoroscopy Time: 10.4 minutes.  Procedure:  The procedure, risks, benefits, and alternatives were explained to the patient.  Questions regarding the procedure were encouraged  and answered.  The patient understands and consents to the procedure.  The right groin was prepped with betadine in a sterile fashion, and a sterile drape was applied covering the operative field.  A sterile gown and sterile gloves were used for the procedure. Local anesthesia was provided with 1% Lidocaine.  Access of the right common femoral artery was performed under ultrasound guidance with a micropuncture set.  Ultrasound image documentation was performed.  A 5-French sheath was placed.  A 5- [place] Cobra catheter was then advanced into the abdominal aorta.  The catheter was further advanced into the trunk of the SMA. Selective arteriography was performed.  A coaxial microcatheter was then placed and advanced into a fourth order jejunal branch. Selective arteriography was performed in several projections including post nitroglycerin administration run to provide vasodilatation.  The microcatheter was removed.  The celiac axis was catheterized with selective arteriography performed.  A second celiac run was also performed through a 5-French Sos catheter advanced into the celiac axis.  Complications: None  Findings: Superior mesenteric arteriography demonstrates normal branches supplying small bowel and proximal colon.  Superiorly oriented branches are well delineated supplying the loop of jejunum which has been pulled up to the level of the stomach after gastric bypass.  The branches supplying the loop of jejunum were able to be further studied with selective microcatheter injection.  This demonstrates multiple small parallel branches extending up to the gastrojejunal anastomosis but no evidence of active bleeding or abnormal vessels. Given arteriographic appearance and anatomy, it was not possible to perform selective embolization.  Celiac arteriography shows normal branches including a small left gastric artery and left inferior phrenic artery.  There is no abnormal supply to the region of the stomach or  jejunum.  IMPRESSION: No active bleeding source identified by arteriography of the superior mesenteric artery and celiac axis as above.  This included subselective catheterization of the jejunal branches supplying the loop of jejunum which has been brought up to the stomach for bypass.  Selective embolization was not possible.  Original Report Authenticated By: Reola Calkins, M.D.   Ir Angiogram Selective Each Additional Vessel  12/30/2011  *RADIOLOGY REPORT*  Clinical Data: Status post prior gastric bypass surgery.  Prolonged and active upper GI bleed with endoscopic confirmation of bleeding from a jejunal ulcer near the gastrojejunal anastomosis.  1.  SELECTIVE ARTERIOGRAPHY OF THE SUPERIOR MESENTERIC ARTERY INCLUDING FOURTH ORDER MICROCATHETER ARTERIOGRAPHY 2.  SELECTIVE ARTERIOGRAPHY OF THE CELIAC AXIS  Comparison:  Prior CT and bleeding scan from [facility]  Sedation: 1.0 mg IV Versed; 50 mcg IV Fentanyl.  Total Moderate Sedation Time: 50 minutes.  Contrast:  100 ml Omnipaque-300  Additional Medications: 100 mcg intra-arterial nitroglycerin  Fluoroscopy Time: 10.4 minutes.  Procedure:  The procedure, risks, benefits, and alternatives were explained to the patient.  Questions regarding the procedure were encouraged and answered.  The patient understands and consents to the procedure.  The right groin was prepped with betadine in a sterile fashion, and a sterile drape was applied covering the operative field.  A sterile gown and sterile gloves were used for the procedure. Local anesthesia was provided  with 1% Lidocaine.  Access of the right common femoral artery was performed under ultrasound guidance with a micropuncture set.  Ultrasound image documentation was performed.  A 5-French sheath was placed.  A 5- [place] Cobra catheter was then advanced into the abdominal aorta.  The catheter was further advanced into the trunk of the SMA. Selective arteriography was performed.  A coaxial microcatheter  was then placed and advanced into a fourth order jejunal branch. Selective arteriography was performed in several projections including post nitroglycerin administration run to provide vasodilatation.  The microcatheter was removed.  The celiac axis was catheterized with selective arteriography performed.  A second celiac run was also performed through a 5-French Sos catheter advanced into the celiac axis.  Complications: None  Findings: Superior mesenteric arteriography demonstrates normal branches supplying small bowel and proximal colon.  Superiorly oriented branches are well delineated supplying the loop of jejunum which has been pulled up to the level of the stomach after gastric bypass.  The branches supplying the loop of jejunum were able to be further studied with selective microcatheter injection.  This demonstrates multiple small parallel branches extending up to the gastrojejunal anastomosis but no evidence of active bleeding or abnormal vessels. Given arteriographic appearance and anatomy, it was not possible to perform selective embolization.  Celiac arteriography shows normal branches including a small left gastric artery and left inferior phrenic artery.  There is no abnormal supply to the region of the stomach or jejunum.  IMPRESSION: No active bleeding source identified by arteriography of the superior mesenteric artery and celiac axis as above.  This included subselective catheterization of the jejunal branches supplying the loop of jejunum which has been brought up to the stomach for bypass.  Selective embolization was not possible.  Original Report Authenticated By: Reola Calkins, M.D.   Ir US Guide Vasc Access Right  12/30/2011  *RADIOLOGY REPORT*  Clinical Data: Status post prior gastric bypass surgery.  Prolonged and active upper GI bleed with endoscopic confirmation of bleeding from a jejunal ulcer near the gastrojejunal anastomosis.  1.  SELECTIVE ARTERIOGRAPHY OF THE SUPERIOR  MESENTERIC ARTERY INCLUDING FOURTH ORDER MICROCATHETER ARTERIOGRAPHY 2.  SELECTIVE ARTERIOGRAPHY OF THE CELIAC AXIS  Comparison:  Prior CT and bleeding scan from [facility]  Sedation: 1.0 mg IV Versed; 50 mcg IV Fentanyl.  Total Moderate Sedation Time: 50 minutes.  Contrast:  100 ml Omnipaque-300  Additional Medications: 100 mcg intra-arterial nitroglycerin  Fluoroscopy Time: 10.4 minutes.  Procedure:  The procedure, risks, benefits, and alternatives were explained to the patient.  Questions regarding the procedure were encouraged and answered.  The patient understands and consents to the procedure.  The right groin was prepped with betadine in a sterile fashion, and a sterile drape was applied covering the operative field.  A sterile gown and sterile gloves were used for the procedure. Local anesthesia was provided with 1% Lidocaine.  Access of the right common femoral artery was performed under ultrasound guidance with a micropuncture set.  Ultrasound image documentation was performed.  A 5-French sheath was placed.  A 5- [place] Cobra catheter was then advanced into the abdominal aorta.  The catheter was further advanced into the trunk of the SMA. Selective arteriography was performed.  A coaxial microcatheter was then placed and advanced into a fourth order jejunal branch. Selective arteriography was performed in several projections including post nitroglycerin administration run to provide vasodilatation.  The microcatheter was removed.  The celiac axis was catheterized with selective arteriography performed.  A  second celiac run was also performed through a 5-French Sos catheter advanced into the celiac axis.  Complications: None  Findings: Superior mesenteric arteriography demonstrates normal branches supplying small bowel and proximal colon.  Superiorly oriented branches are well delineated supplying the loop of jejunum which has been pulled up to the level of the stomach after gastric bypass.  The  branches supplying the loop of jejunum were able to be further studied with selective microcatheter injection.  This demonstrates multiple small parallel branches extending up to the gastrojejunal anastomosis but no evidence of active bleeding or abnormal vessels. Given arteriographic appearance and anatomy, it was not possible to perform selective embolization.  Celiac arteriography shows normal branches including a small left gastric artery and left inferior phrenic artery.  There is no abnormal supply to the region of the stomach or jejunum.  IMPRESSION: No active bleeding source identified by arteriography of the superior mesenteric artery and celiac axis as above.  This included subselective catheterization of the jejunal branches supplying the loop of jejunum which has been brought up to the stomach for bypass.  Selective embolization was not possible.  Original Report Authenticated By: Reola Calkins, M.D.    Microbiology: Recent Results (from the past 240 hour(s))  MRSA PCR SCREENING     Status: Normal   Collection Time   12/29/11  6:13 PM      Component Value Range Status Comment   MRSA by PCR NEGATIVE  NEGATIVE  Final      Labs: Basic Metabolic Panel:  Lab 12/29/11 4782 12/28/11 1655  NA 139 132*  K 3.6 4.1  CL 109 101  CO2 26 24  GLUCOSE 138* 160*  BUN 28* 27*  CREATININE 0.89 0.98  CALCIUM 7.4* 8.2*  MG -- --  PHOS -- --   Liver Function Tests:  Lab 12/28/11 1655  AST 19  ALT 15  ALKPHOS 65  BILITOT 0.2*  PROT 4.6*  ALBUMIN 2.4*   No results found for this basename: LIPASE:5,AMYLASE:5 in the last 168 hours No results found for this basename: AMMONIA:5 in the last 168 hours CBC:  Lab 12/30/11 0305 12/29/11 2355 12/29/11 2010 12/29/11 0900 12/29/11 0740 12/28/11 1655  WBC 8.0 8.3 10.2 7.1 7.2 --  NEUTROABS -- -- -- -- -- 7.6  HGB 8.8* 8.6* 9.8* 6.4* 6.4* --  HCT 24.7* 24.7* 27.9* 18.7* 18.6* --  MCV 85.5 85.5 86.6 86.2 85.3 --  PLT 173 185 192 225 203 --    Cardiac Enzymes: No results found for this basename: CKTOTAL:5,CKMB:5,CKMBINDEX:5,TROPONINI:5 in the last 168 hours BNP: No components found with this basename: POCBNP:5 CBG:  Lab 12/30/11 1200 12/29/11 1412  GLUCAP 136* 193*    Time coordinating discharge: 40 minutes.  Signed:  Brendia Sacks, MD  [facility] 12/30/2011, 12:52 PM

## 2011-12-30 NOTE — Evaluation (Signed)
Physical Therapy Evaluation Patient Details Name: Tracy Odom MRN: [id number] DOB: 03-Oct-1946 Today's Date: 12/30/2011  Problem List:  Patient Active Problem List  Diagnoses  . Thyroid disease  . Upper GI bleed  . Hyperlipemia    Past Medical History:  Past Medical History  Diagnosis Date  . Diabetes mellitus   . Thyroid disease     s/p thyroidectomy for thyroid cancer in 1971  . Hyperlipemia   . Nephrolithiasis   . Chronic back pain     compression fractures, 1 thoracic, 2 lumbar  . Upper GI bleed     Complicated course 11/2011 with EGD x3 showing ulcers at margins of gastric bypass  . Sleep apnea     loss weight after gastric bypass syrgery   Past Surgical History:  Past Surgical History  Procedure Date  . Gastric bypass   . Abdominal surgery     panniculectomy   . Rotator cuff repair     bilateral  . Esophagogastroduodenoscopy 12/29/2011    Procedure: ESOPHAGOGASTRODUODENOSCOPY (EGD);  Surgeon: Erick Blinks, MD;  Location: [facility];  Service: Gastroenterology;  Laterality: N/A;    PT Assessment/Plan/Recommendation PT Assessment Clinical Impression Statement: Pt presents with upper GI bleed with decreased mobility.  Pt tolerates ambulation well with RW and states that he would like to eventually ambulate w/o RW.  Blood pressure remained stable throughout session.  Pt would benefit from skilled PT in acute venue in order to address deficits and return to prior level of functioning.  No follow up PT recommended.   PT Recommendation/Assessment: Patient will need skilled PT in the acute care venue PT Problem List: Decreased mobility;Decreased activity tolerance Barriers to Discharge: None PT Therapy Diagnosis : Generalized weakness;Difficulty walking PT Plan PT Frequency: Min 3X/week PT Treatment/Interventions: Gait training;Stair training;Functional mobility training;Therapeutic activities;Therapeutic exercise;Patient/family education PT Recommendation Follow Up  Recommendations: No PT follow up Equipment Recommended: None recommended by PT PT Goals  Acute Rehab PT Goals PT Goal Formulation: With patient Time For Goal Achievement: 2 weeks Pt will go Supine/Side to Sit: with modified independence PT Goal: Supine/Side to Sit - Progress: Goal set today Pt will go Sit to Supine/Side: with modified independence PT Goal: Sit to Supine/Side - Progress: Goal set today Pt will go Sit to Stand: Independently PT Goal: Sit to Stand - Progress: Goal set today Pt will go Stand to Sit: Independently PT Goal: Stand to Sit - Progress: Goal set today Pt will Ambulate: >150 feet;Independently PT Goal: Ambulate - Progress: Goal set today Pt will Go Up / Down Stairs: 3-5 stairs;with modified independence PT Goal: Up/Down Stairs - Progress: Goal set today  PT Evaluation Precautions/Restrictions  Precautions Required Braces or Orthoses: No Restrictions Weight Bearing Restrictions: No Prior Functioning  Home Living Lives With: Spouse Receives Help From: Family Type of Home: House Home Layout: One level Home Access: Stairs to enter Entrance Stairs-Rails: None Entrance Stairs-Number of Steps: 3, then 1 more from [employer] Equipment: Straight cane Prior Function Level of Independence: Independent with basic ADLs;Independent with gait;Independent with transfers Driving: Yes Vocation: Part time employment Comments: Pt states he is retired, but does some part time work . Cognition Cognition Arousal/Alertness: Awake/alert Overall Cognitive Status: Appears within functional limits for tasks assessed Orientation Level: Oriented X4 Sensation/Coordination Sensation Light Touch: Appears Intact Coordination Gross Motor Movements are Fluid and Coordinated: Yes Extremity Assessment RLE Strength RLE Overall Strength Comments: Ankle, knee and hip motions 5/5 LLE Strength LLE Overall Strength Comments: ankle and knee motions 5/5,  hip 4/5  Mobility  (including Balance) Bed Mobility Bed Mobility: Yes Rolling Left: 5: Supervision Rolling Left Details (indicate cue type and reason): Cues for log rolling technique due to history of compression fractures.  Left Sidelying to Sit: 1: +2 Total assist;Patient percentage (comment);HOB flat Left Sidelying to Sit Details (indicate cue type and reason): Pt assist 60%.  +2 for safety and some assist for trunk.  Cues for LE managment and hand placement to assist trunk into sitting. Requires assist for trunk.  Transfers Transfers: Yes Sit to Stand: 1: +2 Total assist;Patient percentage (comment);From elevated surface;From bed;With upper extremity assist Sit to Stand Details (indicate cue type and reason): Pt assist 70%.  +2 for safety and line management with cues for UE/LE placement.  Stand to Sit: 1: +2 Total assist;Patient percentage (comment);With upper extremity assist;With armrests;To chair/3-in-1 Stand to Sit Details: Pt assist 80%.  +2 for safety and line management with cues for hand placement on chair before sitting and to maintain inside RW until pt sits.  Ambulation/Gait Ambulation/Gait: Yes Ambulation/Gait Assistance: 1: +2 Total assist;Patient percentage (comment) Ambulation/Gait Assistance Details (indicate cue type and reason): Pt assist 90%.  +2 for safety and line management.  Min/guard provided due to pt stating that he has been orthostatic in past.  Cues for upright posture.  Ambulation Distance (Feet): 400 Feet Assistive device: Rolling walker Gait Pattern: Step-through pattern Gait velocity: WFL    Exercise    End of Session PT - End of Session Equipment Utilized During Treatment: Gait belt Activity Tolerance: Patient tolerated treatment well Patient left: in chair;with call bell in reach;with family/visitor present Nurse Communication: Mobility status for transfers;Mobility status for ambulation General Behavior During Session: [facility] for tasks performed Cognition: [facility] for  tasks performed  Page, Meribeth Mattes 12/30/2011, 12:14 PM

## 2011-12-30 NOTE — Progress Notes (Signed)
PROGRESS NOTE  Tracy Odom WUJ:[id number] DOB: 10-10-1946 DOA: 12/28/2011 PCP: [facility]  Brief narrative: 66 year old man presented with GI bleed. Complicated history of GI bleed over the last 6 weeks has been documented.  Past medical history: Gastric bypass in 2007, chronic back pain,  Consultants:  General surgery  Gastroenterology  Interventional radiology  Procedures:  February 8: Emergent mesenteric arteriogram: No bleeding identified. No intervention.  February 6: 1 unit packed red blood cell transfusion  February 7: 4 units packed red blood cell transfusion  Antibiotics:  None  Interim History: No bleeding overnight.  Subjective: No complaints. No pain. Reports bowel movement this morning. No blood.  Objective: Filed Vitals:   12/30/11 0200 12/30/11 0300 12/30/11 0400 12/30/11 0500  BP: 124/52 132/53 129/50 133/51  Pulse: 65 75 65 64  Temp:   97.9 F (36.6 C)   TempSrc:   Oral   Resp: 12 11 14 12   Height:      Weight:   110.4 kg (243 lb 6.2 oz)   SpO2: 100% 100% 100% 95%    Intake/Output Summary (Last 24 hours) at 12/30/11 0736 Last data filed at 12/30/11 0600  Gross per 24 hour  Intake   3103 ml  Output   2500 ml  Net    603 ml    Exam:  General:  Appears calm and comfortable.  Eyes: Pupils equal, round, react to light.  ENT: Lips and tongue appear unremarkable. Hearing grossly normal.  Cardiovascular: Regular rate and rhythm. No murmur, rub, gallop. No significant lower extremity edema.  Respiratory: Clear to auscultation bilaterally. No wheezes, rales, rhonchi. Normal respiratory effort.  Abdomen: Soft, nontender, nondistended. Obese.  Skin: Grossly unremarkable.  Musculoskeletal: Grossly normal tone and strength in the upper and lower extremities bilaterally.  Psychiatric: Grossly normal mood and affect.  Neurologic: Grossly nonfocal.  Data Reviewed: Basic Metabolic Panel:  Lab 12/29/11 9562 12/28/11 1655  NA 139  132*  K 3.6 4.1  CL 109 101  CO2 26 24  GLUCOSE 138* 160*  BUN 28* 27*  CREATININE 0.89 0.98  CALCIUM 7.4* 8.2*  MG -- --  PHOS -- --   Liver Function Tests:  Lab 12/28/11 1655  AST 19  ALT 15  ALKPHOS 65  BILITOT 0.2*  PROT 4.6*  ALBUMIN 2.4*   CBC:  Lab 12/30/11 0305 12/29/11 2355 12/29/11 2010 12/29/11 0900 12/29/11 0740 12/28/11 1655  WBC 8.0 8.3 10.2 7.1 7.2 --  NEUTROABS -- -- -- -- -- 7.6  HGB 8.8* 8.6* 9.8* 6.4* 6.4* --  HCT 24.7* 24.7* 27.9* 18.7* 18.6* --  MCV 85.5 85.5 86.6 86.2 85.3 --  PLT 173 185 192 225 203 --   CBG:  Lab 12/29/11 1412  GLUCAP 193*    Recent Results (from the past 240 hour(s))  MRSA PCR SCREENING     Status: Normal   Collection Time   12/29/11  6:13 PM      Component Value Range Status Comment   MRSA by PCR NEGATIVE  NEGATIVE  Final      Studies: No results found.   Scheduled Meds:   . sodium chloride   Intravenous STAT  . lidocaine      . nitroGLYCERIN  100 mcg Intra-arterial to XRAY  . sodium chloride  3 mL Intravenous Q12H  . DISCONTD: DULoxetine  60 mg Oral Daily  . DISCONTD: levothyroxine  162 mcg Oral Daily  . DISCONTD: simvastatin  10 mg Oral q1800   Continuous Infusions:   .  sodium chloride 1,000 mL (12/29/11 1408)  . pantoprozole (PROTONIX) infusion 8 mg/hr (12/30/11 0555)  . DISCONTD: norepinephrine (LEVOPHED) Adult infusion 2 mcg/min (12/29/11 1427)     Assessment/Plan: 1. 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 Status post total 5 unit packed red blood cells at this point. Hemoglobin stable. No recurrent bleeding since transfer to the ICU. Protonix infusion per GI. Further recommendations per GI. 2. Hemorrhagic shock: Resolved with transfusion and fluid resuscitation. 3. Acute blood loss anemia: As above. Continue to monitor CBC. 4. Diabetes mellitus: Stable. Start sliding scale insulin. 5. Iatrogenic Hypothyroidism: Continue replacement therapy. Status post thyroidectomy for  thyroid cancer 1971. 6. Chronic back pain: No NSAIDs now or in the future.  Code Status: Full code Family Communication:  Disposition Plan: Pending further evaluation and treatment.   Brendia Sacks, MD  [facility] Pager [phone] 12/30/2011, 7:36 AM    LOS: 2 days

## 2011-12-30 NOTE — Progress Notes (Signed)
Patient seems to be stable, with no signs of current bleeding. Ambulating with physical therapy.  I spoke with the patient and his wife.  While [facility] is geographically inconvenient for them, I think the patient would be better served by being managed at [facility] by his original Careers adviser.  There are no bariatric surgeons on call here this weekend, so if he started rebleeding, then he would probably need emergent open revision of his bypass by a non-bariatric general surgeon.  He probably needs a repeat endoscopy in the near future to reassess the marginal ulcer.  I will speak with his original surgeon - Dr. Dianna Rossetti - in [place] to try to arrange transfer.  Tracy Arms. [name], MD, [facility]  12/30/2011 10:49 AM

## 2011-12-30 NOTE — Progress Notes (Signed)
Met with pt/spouse to offer support and assistance with d/c planning. Pt has declined returning to [facility] upon d/c. He prefers to return home with [facility] RNCM aware and will assist with d/c planning.

## 2011-12-30 NOTE — Progress Notes (Signed)
[facility] Progress Note  Subjective: Passed 1 black stool this am.  No further hematochezia or loose stools.  No abd pain.  Objective:  Vital signs in last 24 hours: Temp:  [97.9 F (36.6 C)-99.5 F (37.5 C)] 98.8 F (37.1 C) (02/08 1200) Pulse Rate:  [60-90] 86  (02/08 1200) Resp:  [6-78] 10  (02/08 1200) BP: (87-156)/(41-90) 113/61 mmHg (02/08 1200) SpO2:  [95 %-100 %] 100 % (02/08 1200) Weight:  [243 lb 6.2 oz (110.4 kg)-244 lb 4.3 oz (110.8 kg)] 243 lb 6.2 oz (110.4 kg) (02/08 0400) Last BM Date: 12/28/11 General:   Alert,  NAD Heart:  Regular rate and rhythm; no murmurs Chest: CTA b/l Abdomen:  Soft, obese, nontender and nondistended. Normal bowel sounds, without guarding, and without rebound.   Extremities:  Trace pretib edema Neurologic:  Alert and  oriented x4;  grossly normal neurologically. Psych:  Alert and cooperative. Normal mood and affect.  Intake/Output from previous day: 02/07 0701 - 02/08 0700 In: 3103 [I.V.:2028; Blood:1075] Out: 2500 [Urine:2500] Intake/Output this shift: Total I/O In: 100 [I.V.:100] Out: 401 [Urine:400; Stool:1]  Lab Results:  [facility] 12/30/11 0305 12/29/11 2355 12/29/11 2010  WBC 8.0 8.3 10.2  HGB 8.8* 8.6* 9.8*  HCT 24.7* 24.7* 27.9*  PLT 173 185 192   BMET  Basename 12/29/11 0740 12/28/11 1655  NA 139 132*  K 3.6 4.1  CL 109 101  CO2 26 24  GLUCOSE 138* 160*  BUN 28* 27*  CREATININE 0.89 0.98  CALCIUM 7.4* 8.2*   LFT  Basename 12/28/11 1655  PROT 4.6*  ALBUMIN 2.4*  AST 19  ALT 15  ALKPHOS 65  BILITOT 0.2*  BILIDIR --  IBILI --   PT/INR  Basename 12/28/11 1655  LABPROT 13.6  INR 1.02   Hepatitis Panel No results found for this basename: HEPBSAG,HCVAB,HEPAIGM,HEPBIGM in the last 72 hours  Studies/Results: Ir Angiogram Visceral Selective  12/30/2011  *RADIOLOGY REPORT*  Clinical Data: Status post prior gastric bypass surgery.  Prolonged and active upper GI bleed with endoscopic  confirmation of bleeding from a jejunal ulcer near the gastrojejunal anastomosis.  1.  SELECTIVE ARTERIOGRAPHY OF THE SUPERIOR MESENTERIC ARTERY INCLUDING FOURTH ORDER MICROCATHETER ARTERIOGRAPHY 2.  SELECTIVE ARTERIOGRAPHY OF THE CELIAC AXIS  Comparison:  Prior CT and bleeding scan from [facility]  Sedation: 1.0 mg IV Versed; 50 mcg IV Fentanyl.  Total Moderate Sedation Time: 50 minutes.  Contrast:  100 ml Omnipaque-300  Additional Medications: 100 mcg intra-arterial nitroglycerin  Fluoroscopy Time: 10.4 minutes.  Procedure:  The procedure, risks, benefits, and alternatives were explained to the patient.  Questions regarding the procedure were encouraged and answered.  The patient understands and consents to the procedure.  The right groin was prepped with betadine in a sterile fashion, and a sterile drape was applied covering the operative field.  A sterile gown and sterile gloves were used for the procedure. Local anesthesia was provided with 1% Lidocaine.  Access of the right common femoral artery was performed under ultrasound guidance with a micropuncture set.  Ultrasound image documentation was performed.  A 5-French sheath was placed.  A 5- [place] Cobra catheter was then advanced into the abdominal aorta.  The catheter was further advanced into the trunk of the SMA. Selective arteriography was performed.  A coaxial microcatheter was then placed and advanced into a fourth order jejunal branch. Selective arteriography was performed in several projections including post nitroglycerin administration run to provide vasodilatation.  The microcatheter was removed.  The  celiac axis was catheterized with selective arteriography performed.  A second celiac run was also performed through a 5-French Sos catheter advanced into the celiac axis.  Complications: None  Findings: Superior mesenteric arteriography demonstrates normal branches supplying small bowel and proximal colon.  Superiorly oriented branches are  well delineated supplying the loop of jejunum which has been pulled up to the level of the stomach after gastric bypass.  The branches supplying the loop of jejunum were able to be further studied with selective microcatheter injection.  This demonstrates multiple small parallel branches extending up to the gastrojejunal anastomosis but no evidence of active bleeding or abnormal vessels. Given arteriographic appearance and anatomy, it was not possible to perform selective embolization.  Celiac arteriography shows normal branches including a small left gastric artery and left inferior phrenic artery.  There is no abnormal supply to the region of the stomach or jejunum.  IMPRESSION: No active bleeding source identified by arteriography of the superior mesenteric artery and celiac axis as above.  This included subselective catheterization of the jejunal branches supplying the loop of jejunum which has been brought up to the stomach for bypass.  Selective embolization was not possible.  Original Report Authenticated By: Reola Calkins, M.D.   Ir Angiogram Visceral Selective  12/30/2011  *RADIOLOGY REPORT*  Clinical Data: Status post prior gastric bypass surgery.  Prolonged and active upper GI bleed with endoscopic confirmation of bleeding from a jejunal ulcer near the gastrojejunal anastomosis.  1.  SELECTIVE ARTERIOGRAPHY OF THE SUPERIOR MESENTERIC ARTERY INCLUDING FOURTH ORDER MICROCATHETER ARTERIOGRAPHY 2.  SELECTIVE ARTERIOGRAPHY OF THE CELIAC AXIS  Comparison:  Prior CT and bleeding scan from [facility]  Sedation: 1.0 mg IV Versed; 50 mcg IV Fentanyl.  Total Moderate Sedation Time: 50 minutes.  Contrast:  100 ml Omnipaque-300  Additional Medications: 100 mcg intra-arterial nitroglycerin  Fluoroscopy Time: 10.4 minutes.  Procedure:  The procedure, risks, benefits, and alternatives were explained to the patient.  Questions regarding the procedure were encouraged and answered.  The patient understands and  consents to the procedure.  The right groin was prepped with betadine in a sterile fashion, and a sterile drape was applied covering the operative field.  A sterile gown and sterile gloves were used for the procedure. Local anesthesia was provided with 1% Lidocaine.  Access of the right common femoral artery was performed under ultrasound guidance with a micropuncture set.  Ultrasound image documentation was performed.  A 5-French sheath was placed.  A 5- [place] Cobra catheter was then advanced into the abdominal aorta.  The catheter was further advanced into the trunk of the SMA. Selective arteriography was performed.  A coaxial microcatheter was then placed and advanced into a fourth order jejunal branch. Selective arteriography was performed in several projections including post nitroglycerin administration run to provide vasodilatation.  The microcatheter was removed.  The celiac axis was catheterized with selective arteriography performed.  A second celiac run was also performed through a 5-French Sos catheter advanced into the celiac axis.  Complications: None  Findings: Superior mesenteric arteriography demonstrates normal branches supplying small bowel and proximal colon.  Superiorly oriented branches are well delineated supplying the loop of jejunum which has been pulled up to the level of the stomach after gastric bypass.  The branches supplying the loop of jejunum were able to be further studied with selective microcatheter injection.  This demonstrates multiple small parallel branches extending up to the gastrojejunal anastomosis but no evidence of active bleeding or abnormal vessels. Given  arteriographic appearance and anatomy, it was not possible to perform selective embolization.  Celiac arteriography shows normal branches including a small left gastric artery and left inferior phrenic artery.  There is no abnormal supply to the region of the stomach or jejunum.  IMPRESSION: No active bleeding source  identified by arteriography of the superior mesenteric artery and celiac axis as above.  This included subselective catheterization of the jejunal branches supplying the loop of jejunum which has been brought up to the stomach for bypass.  Selective embolization was not possible.  Original Report Authenticated By: Reola Calkins, M.D.   Ir Angiogram Selective Each Additional Vessel  12/30/2011  *RADIOLOGY REPORT*  Clinical Data: Status post prior gastric bypass surgery.  Prolonged and active upper GI bleed with endoscopic confirmation of bleeding from a jejunal ulcer near the gastrojejunal anastomosis.  1.  SELECTIVE ARTERIOGRAPHY OF THE SUPERIOR MESENTERIC ARTERY INCLUDING FOURTH ORDER MICROCATHETER ARTERIOGRAPHY 2.  SELECTIVE ARTERIOGRAPHY OF THE CELIAC AXIS  Comparison:  Prior CT and bleeding scan from [facility]  Sedation: 1.0 mg IV Versed; 50 mcg IV Fentanyl.  Total Moderate Sedation Time: 50 minutes.  Contrast:  100 ml Omnipaque-300  Additional Medications: 100 mcg intra-arterial nitroglycerin  Fluoroscopy Time: 10.4 minutes.  Procedure:  The procedure, risks, benefits, and alternatives were explained to the patient.  Questions regarding the procedure were encouraged and answered.  The patient understands and consents to the procedure.  The right groin was prepped with betadine in a sterile fashion, and a sterile drape was applied covering the operative field.  A sterile gown and sterile gloves were used for the procedure. Local anesthesia was provided with 1% Lidocaine.  Access of the right common femoral artery was performed under ultrasound guidance with a micropuncture set.  Ultrasound image documentation was performed.  A 5-French sheath was placed.  A 5- [place] Cobra catheter was then advanced into the abdominal aorta.  The catheter was further advanced into the trunk of the SMA. Selective arteriography was performed.  A coaxial microcatheter was then placed and advanced into a fourth order  jejunal branch. Selective arteriography was performed in several projections including post nitroglycerin administration run to provide vasodilatation.  The microcatheter was removed.  The celiac axis was catheterized with selective arteriography performed.  A second celiac run was also performed through a 5-French Sos catheter advanced into the celiac axis.  Complications: None  Findings: Superior mesenteric arteriography demonstrates normal branches supplying small bowel and proximal colon.  Superiorly oriented branches are well delineated supplying the loop of jejunum which has been pulled up to the level of the stomach after gastric bypass.  The branches supplying the loop of jejunum were able to be further studied with selective microcatheter injection.  This demonstrates multiple small parallel branches extending up to the gastrojejunal anastomosis but no evidence of active bleeding or abnormal vessels. Given arteriographic appearance and anatomy, it was not possible to perform selective embolization.  Celiac arteriography shows normal branches including a small left gastric artery and left inferior phrenic artery.  There is no abnormal supply to the region of the stomach or jejunum.  IMPRESSION: No active bleeding source identified by arteriography of the superior mesenteric artery and celiac axis as above.  This included subselective catheterization of the jejunal branches supplying the loop of jejunum which has been brought up to the stomach for bypass.  Selective embolization was not possible.  Original Report Authenticated By: Reola Calkins, M.D.   Ir US Guide Vasc Access  Right  12/30/2011  *RADIOLOGY REPORT*  Clinical Data: Status post prior gastric bypass surgery.  Prolonged and active upper GI bleed with endoscopic confirmation of bleeding from a jejunal ulcer near the gastrojejunal anastomosis.  1.  SELECTIVE ARTERIOGRAPHY OF THE SUPERIOR MESENTERIC ARTERY INCLUDING FOURTH ORDER MICROCATHETER  ARTERIOGRAPHY 2.  SELECTIVE ARTERIOGRAPHY OF THE CELIAC AXIS  Comparison:  Prior CT and bleeding scan from [facility]  Sedation: 1.0 mg IV Versed; 50 mcg IV Fentanyl.  Total Moderate Sedation Time: 50 minutes.  Contrast:  100 ml Omnipaque-300  Additional Medications: 100 mcg intra-arterial nitroglycerin  Fluoroscopy Time: 10.4 minutes.  Procedure:  The procedure, risks, benefits, and alternatives were explained to the patient.  Questions regarding the procedure were encouraged and answered.  The patient understands and consents to the procedure.  The right groin was prepped with betadine in a sterile fashion, and a sterile drape was applied covering the operative field.  A sterile gown and sterile gloves were used for the procedure. Local anesthesia was provided with 1% Lidocaine.  Access of the right common femoral artery was performed under ultrasound guidance with a micropuncture set.  Ultrasound image documentation was performed.  A 5-French sheath was placed.  A 5- [place] Cobra catheter was then advanced into the abdominal aorta.  The catheter was further advanced into the trunk of the SMA. Selective arteriography was performed.  A coaxial microcatheter was then placed and advanced into a fourth order jejunal branch. Selective arteriography was performed in several projections including post nitroglycerin administration run to provide vasodilatation.  The microcatheter was removed.  The celiac axis was catheterized with selective arteriography performed.  A second celiac run was also performed through a 5-French Sos catheter advanced into the celiac axis.  Complications: None  Findings: Superior mesenteric arteriography demonstrates normal branches supplying small bowel and proximal colon.  Superiorly oriented branches are well delineated supplying the loop of jejunum which has been pulled up to the level of the stomach after gastric bypass.  The branches supplying the loop of jejunum were able to be  further studied with selective microcatheter injection.  This demonstrates multiple small parallel branches extending up to the gastrojejunal anastomosis but no evidence of active bleeding or abnormal vessels. Given arteriographic appearance and anatomy, it was not possible to perform selective embolization.  Celiac arteriography shows normal branches including a small left gastric artery and left inferior phrenic artery.  There is no abnormal supply to the region of the stomach or jejunum.  IMPRESSION: No active bleeding source identified by arteriography of the superior mesenteric artery and celiac axis as above.  This included subselective catheterization of the jejunal branches supplying the loop of jejunum which has been brought up to the stomach for bypass.  Selective embolization was not possible.  Original Report Authenticated By: Reola Calkins, M.D.     Assessment / Plan: 66 yo male with recurrent UGI bleeding from marginal ulceration.  1. UGI bleeding -- unsuccessful endoclip yesterday (difficult location with visible vessel on jejunal side of anastomois making endo-therapy difficult) , active bleeding slowed by epi injection.  Attempt at angio did not reveal source of bleeding.  Hgb is improved now after transfusion. Repeat Hgb pending. Would continue PPI gtt for now.  Plan is for transfer to [facility] where there is bariatric surgery back up.  Repeat EGD is reasonable, but after multiple attempts at endo-therapy, I am not optimistic that endotherapy will be successful at preventing rebleeding.  Also, bariatric surgical backup will be  important before any repeat EGD.  Other options for rebleeding, include repeat attempt at angio (but this option is temporary only and likely would not result to healing of the gastrojej anastomotic ulcer).     LOS: 2 days   Tracy Odom M  12/30/2011, 12:31 PM

## 2011-12-30 NOTE — Progress Notes (Signed)
   CARE MANAGEMENT NOTE 12/30/2011  Patient:  Tracy Odom, Tracy Odom   Account Number:  [id number]  Date Initiated:  12/29/2011  Documentation initiated by:  Lanier Clam  Subjective/Objective Assessment:   ADMITTED W/DIZZINESS,[email or web address].     Action/Plan:   FROM HOME W/SPOUSE   Anticipated DC Date:  12/30/2011   Anticipated DC Plan:  ACUTE TO ACUTE TRANS         Choice offered to / List presented to:             Status of service:  Completed, signed off Medicare Important Message given?   (If response is "NO", the following Medicare IM given date fields will be blank) Date Medicare IM given:   Date Additional Medicare IM given:    Discharge Disposition:  ACUTE TO ACUTE TRANS  Per UR Regulation:  Reviewed for med. necessity/level of care/duration of stay  Comments:  12/30/11 Lincy Belles RN,BSN NCM [phone] PATIENT FOR TRANSFER TO [facility].NURSE COORDINATING TRANSFER W/FORMS/CHART COPY/CARELINK/REPORT.MD NOTIFIED.PROVIDED PATIENT W/HHC/PRIVATE SITTER LIST AS RESOURCE. 12/29/11 Amberlee Garvey RN,BSN NCM [phone]

## 2011-12-30 NOTE — Progress Notes (Signed)
Pt is on transfer to [facility] for bariatric surgical backup services. Receiving Physician is Dr Demetrios Isaacs. Report called to [facility]. On transport Pt on NS @ 82ml/hr and Protonix @ 57ml/hr.  [facility]  Provided transportation

## 2011-12-30 NOTE — Progress Notes (Signed)
Patient ID: Tracy Odom, male   DOB: 1946/03/28, 66 y.o.   MRN: [id number]   Mesenteric Arteriogram performed 2/7 No bleed identified; No Intervention  Afeb; VSS Rt groin NT; no bleeding; no hematoma Rt foot 2+ pulses hgb stable 8.8  Pt doing well Call us if need [place].

## 2011-12-30 NOTE — Progress Notes (Signed)
CSW following to assist with d/c planning needs. Pt admitted from [facility] . PN reviewed. CSW will offer support to pt/family and assist with d/c planning back to [facility] , if appropriate. ( Possible trans back to [facility] noted in PN ).

## 2012-01-01 LAB — TYPE AND SCREEN
ABO/RH(D): A POS
Antibody Screen: NEGATIVE
Unit division: 0
Unit division: 0

## 2012-01-04 ENCOUNTER — Encounter: Payer: Self-pay | Admitting: Internal Medicine

## 2012-01-05 DIAGNOSIS — E119 Type 2 diabetes mellitus without complications: Secondary | ICD-10-CM | POA: Diagnosis not present

## 2012-01-05 DIAGNOSIS — Z48815 Encounter for surgical aftercare following surgery on the digestive system: Secondary | ICD-10-CM | POA: Diagnosis not present

## 2012-01-05 DIAGNOSIS — R269 Unspecified abnormalities of gait and mobility: Secondary | ICD-10-CM | POA: Diagnosis not present

## 2012-01-05 DIAGNOSIS — K5730 Diverticulosis of large intestine without perforation or abscess without bleeding: Secondary | ICD-10-CM | POA: Diagnosis not present

## 2012-01-05 DIAGNOSIS — M109 Gout, unspecified: Secondary | ICD-10-CM | POA: Diagnosis not present

## 2012-01-06 DIAGNOSIS — Z5181 Encounter for therapeutic drug level monitoring: Secondary | ICD-10-CM | POA: Diagnosis not present

## 2012-01-06 DIAGNOSIS — M109 Gout, unspecified: Secondary | ICD-10-CM | POA: Diagnosis not present

## 2012-01-06 DIAGNOSIS — R269 Unspecified abnormalities of gait and mobility: Secondary | ICD-10-CM | POA: Diagnosis not present

## 2012-01-06 DIAGNOSIS — Z79899 Other long term (current) drug therapy: Secondary | ICD-10-CM | POA: Diagnosis not present

## 2012-01-06 DIAGNOSIS — K5730 Diverticulosis of large intestine without perforation or abscess without bleeding: Secondary | ICD-10-CM | POA: Diagnosis not present

## 2012-01-06 DIAGNOSIS — E119 Type 2 diabetes mellitus without complications: Secondary | ICD-10-CM | POA: Diagnosis not present

## 2012-01-06 DIAGNOSIS — Z48815 Encounter for surgical aftercare following surgery on the digestive system: Secondary | ICD-10-CM | POA: Diagnosis not present

## 2012-01-09 DIAGNOSIS — R269 Unspecified abnormalities of gait and mobility: Secondary | ICD-10-CM | POA: Diagnosis not present

## 2012-01-09 DIAGNOSIS — E119 Type 2 diabetes mellitus without complications: Secondary | ICD-10-CM | POA: Diagnosis not present

## 2012-01-09 DIAGNOSIS — Z5181 Encounter for therapeutic drug level monitoring: Secondary | ICD-10-CM | POA: Diagnosis not present

## 2012-01-09 DIAGNOSIS — K5730 Diverticulosis of large intestine without perforation or abscess without bleeding: Secondary | ICD-10-CM | POA: Diagnosis not present

## 2012-01-09 DIAGNOSIS — Z79899 Other long term (current) drug therapy: Secondary | ICD-10-CM | POA: Diagnosis not present

## 2012-01-09 DIAGNOSIS — Z48815 Encounter for surgical aftercare following surgery on the digestive system: Secondary | ICD-10-CM | POA: Diagnosis not present

## 2012-01-09 DIAGNOSIS — M109 Gout, unspecified: Secondary | ICD-10-CM | POA: Diagnosis not present

## 2012-01-11 DIAGNOSIS — M109 Gout, unspecified: Secondary | ICD-10-CM | POA: Diagnosis not present

## 2012-01-11 DIAGNOSIS — E119 Type 2 diabetes mellitus without complications: Secondary | ICD-10-CM | POA: Diagnosis not present

## 2012-01-11 DIAGNOSIS — R269 Unspecified abnormalities of gait and mobility: Secondary | ICD-10-CM | POA: Diagnosis not present

## 2012-01-11 DIAGNOSIS — K5730 Diverticulosis of large intestine without perforation or abscess without bleeding: Secondary | ICD-10-CM | POA: Diagnosis not present

## 2012-01-11 DIAGNOSIS — Z48815 Encounter for surgical aftercare following surgery on the digestive system: Secondary | ICD-10-CM | POA: Diagnosis not present

## 2012-01-13 DIAGNOSIS — E119 Type 2 diabetes mellitus without complications: Secondary | ICD-10-CM | POA: Diagnosis not present

## 2012-01-13 DIAGNOSIS — K5730 Diverticulosis of large intestine without perforation or abscess without bleeding: Secondary | ICD-10-CM | POA: Diagnosis not present

## 2012-01-13 DIAGNOSIS — M109 Gout, unspecified: Secondary | ICD-10-CM | POA: Diagnosis not present

## 2012-01-13 DIAGNOSIS — R269 Unspecified abnormalities of gait and mobility: Secondary | ICD-10-CM | POA: Diagnosis not present

## 2012-01-13 DIAGNOSIS — Z48815 Encounter for surgical aftercare following surgery on the digestive system: Secondary | ICD-10-CM | POA: Diagnosis not present

## 2012-01-16 ENCOUNTER — Encounter: Payer: Self-pay | Admitting: Internal Medicine

## 2012-01-16 DIAGNOSIS — M109 Gout, unspecified: Secondary | ICD-10-CM | POA: Diagnosis not present

## 2012-01-16 DIAGNOSIS — E119 Type 2 diabetes mellitus without complications: Secondary | ICD-10-CM | POA: Diagnosis not present

## 2012-01-16 DIAGNOSIS — Z48815 Encounter for surgical aftercare following surgery on the digestive system: Secondary | ICD-10-CM | POA: Diagnosis not present

## 2012-01-16 DIAGNOSIS — Z79899 Other long term (current) drug therapy: Secondary | ICD-10-CM | POA: Diagnosis not present

## 2012-01-16 DIAGNOSIS — Z5181 Encounter for therapeutic drug level monitoring: Secondary | ICD-10-CM | POA: Diagnosis not present

## 2012-01-16 DIAGNOSIS — R269 Unspecified abnormalities of gait and mobility: Secondary | ICD-10-CM | POA: Diagnosis not present

## 2012-01-16 DIAGNOSIS — K5730 Diverticulosis of large intestine without perforation or abscess without bleeding: Secondary | ICD-10-CM | POA: Diagnosis not present

## 2012-01-17 ENCOUNTER — Other Ambulatory Visit (INDEPENDENT_AMBULATORY_CARE_PROVIDER_SITE_OTHER): Payer: Medicare Other

## 2012-01-17 ENCOUNTER — Ambulatory Visit (INDEPENDENT_AMBULATORY_CARE_PROVIDER_SITE_OTHER): Payer: Medicare Other | Admitting: Internal Medicine

## 2012-01-17 ENCOUNTER — Encounter: Payer: Self-pay | Admitting: Internal Medicine

## 2012-01-17 DIAGNOSIS — R1310 Dysphagia, unspecified: Secondary | ICD-10-CM

## 2012-01-17 DIAGNOSIS — K922 Gastrointestinal hemorrhage, unspecified: Secondary | ICD-10-CM

## 2012-01-17 DIAGNOSIS — Z86010 Personal history of colonic polyps: Secondary | ICD-10-CM

## 2012-01-17 DIAGNOSIS — K283 Acute gastrojejunal ulcer without hemorrhage or perforation: Secondary | ICD-10-CM | POA: Diagnosis not present

## 2012-01-17 LAB — FERRITIN: Ferritin: 7.1 ng/mL — ABNORMAL LOW (ref 22.0–322.0)

## 2012-01-17 LAB — IBC PANEL
Iron: 20 ug/dL — ABNORMAL LOW (ref 42–165)
Transferrin: 316 mg/dL (ref 212.0–360.0)

## 2012-01-17 MED ORDER — PANTOPRAZOLE SODIUM 40 MG PO TBEC: 40.0000 mg | DELAYED_RELEASE_TABLET | Freq: Two times a day (BID) | ORAL | Status: DC

## 2012-01-17 MED ORDER — SUCRALFATE 1 GM/10ML PO SUSP: 1.0000 g | ORAL | Status: DC

## 2012-01-17 NOTE — Patient Instructions (Signed)
Dr. Rhea Belton recommends you get an Endoscopy around the mid week of May, please call back to schedule.   We sent prescriptions into your pharmacy.

## 2012-01-17 NOTE — Progress Notes (Addendum)
Subjective:    Patient ID: Tracy Odom, male    DOB: [birth year], 67 y.o.   MRN: [id number]  HPI Mr. Vasil is a very pleasant 66 year old male with a past medical history of obesity status post Roux-en-Y gastric bypass, diabetes, thyroid disease, hyperlipidemia, adenomatous colon polyps, gout who is seen in followup after recent hospitalization from a major upper gastrointestinal hemorrhage from an anastomotic ulceration.  The patient was seen in early February 2013 after a prolonged hospitalization with multiple blood transfusions (as many as 25 units of packed red cells). By the time I met him, he had been hospitalized in [facility] and at [facility] in [place]. I performed an upper endoscopy which revealed Roux-en-Y anatomy and an anastomotic ulceration at his gastrojejunal anastomosis. There was a visible vessel which bled after attempted therapy with epinephrine injection and hemostatic clip placement. Surgery was called urgently and the decision was made to transfer him to [facility] where a bariatric surgeon was on call should emergency surgery need to be performed. There he had no further bleeding and underwent yet another upper endoscopy which revealed 2 anastomotic ulcerations but no visible vessel or active bleeding seen. Colonoscopy was also performed revealing 1 adenomatous polyp without high-grade dysplasia. He was subsequently discharged and presents back here today.  Today he reports he is feeling much better, he's had no further bleeding including no hematemesis or melena. He is receiving home health and they are watching his blood counts frequently. He reports his blood counts have slowly improved. He has been on pantoprazole 40 mg twice daily and also sucralfate 4 times daily, but do to an insurance issue the sucralfate was changed from the liquid formulation to the tablet formulation. He denies pain. No nausea or vomiting. Appetite is good.  He has  continued to avoid NSAIDs. He does report Constipation and he is taking docusate for this daily, but still continues to be a problem from time to time. No diarrhea  Review of Systems As per HPI, otherwise neg  Past Medical History  Diagnosis Date  . Diabetes mellitus   . Thyroid disease     s/p thyroidectomy for thyroid cancer in 1971  . Hyperlipemia   . Nephrolithiasis   . Chronic back pain     compression fractures, 1 thoracic, 2 lumbar  . Upper GI bleed     Complicated course 11/2011 with EGD x3 showing ulcers at margins of gastric bypass  . Sleep apnea     loss weight after gastric bypass syrgery  . Depression   . Anemia     Hx of   . Adenomatous polyp 12-2011    Colonoscopy--Dr. Tonny Bollman   . Internal hemorrhoids 12-2011    Colonoscopy-Dr.Hanson   . Ulcer     Anastomotic Gastrojejunal   . Gout    Past Surgical History  Procedure Date  . Gastric bypass   . Abdominal surgery     panniculectomy   . Rotator cuff repair     bilateral  . Esophagogastroduodenoscopy 12/29/2011    Procedure: ESOPHAGOGASTRODUODENOSCOPY (EGD);  Surgeon: Erick Blinks, MD;  Location: [facility];  Service: Gastroenterology;  Laterality: N/A;   Current Outpatient Prescriptions  Medication Sig Dispense Refill  . allopurinol (ZYLOPRIM) 300 MG tablet Take 300 mg by mouth. Takes 150 mg by mouth once daily      . Calcium Carbonate-Vitamin D (CALCIUM-VITAMIN D) 500-200 MG-UNIT per tablet Take 1 tablet by mouth as directed.      . cyanocobalamin (,  VITAMIN B-12,) 1000 MCG/ML injection Inject 1,000 mcg into the muscle every 30 (thirty) days.      . DULoxetine (CYMBALTA) 60 MG capsule Take 60 mg by mouth daily.      Marland Kitchen levothyroxine (SYNTHROID, LEVOTHROID) 137 MCG tablet Take 137 mcg by mouth daily.      Marland Kitchen levothyroxine (SYNTHROID, LEVOTHROID) 25 MCG tablet Take 25 mcg by mouth daily.      . metFORMIN (GLUCOPHAGE) 500 MG tablet Take 500 mg by mouth 2 (two) times daily with a meal.      . Multiple  Vitamins-Minerals (MULTIVITAMIN PO) Take 1 tablet by mouth daily.      . pantoprazole (PROTONIX) 40 MG tablet Take 1 tablet (40 mg total) by mouth 2 (two) times daily.  60 tablet  3  . potassium chloride (K-DUR) 10 MEQ tablet Take 10 mEq by mouth 2 (two) times daily.      . pravastatin (PRAVACHOL) 20 MG tablet Take 20 mg by mouth daily.      . sucralfate (CARAFATE) 1 GM/10ML suspension Take 10 mLs (1 g total) by mouth as directed.  420 mL  2  . traMADol (ULTRAM) 50 MG tablet Take 50 mg by mouth as directed.       No Known Allergies  Family History  Problem Relation Age of Onset  . Malignant hyperthermia Neg Hx   . Colon cancer Neg Hx   . Liver disease Mother    Social History  . Marital Status: Married   Social History Main Topics  . Smoking status: Former Smoker -- 15 years    Quit date: 12/27/1980  . Smokeless tobacco: Never Used  . Alcohol Use: No  . Drug Use: No    Social History Narrative   Wife Jasmine December [phone] works at [employer]. Pt still quite active before this recent hospitalization.       Objective:   Physical Exam BP 122/60  Pulse 80  Ht 6\' 3"  (1.905 m)  Wt 233 lb (105.688 kg)  BMI 29.12 kg/m2 Constitutional: Well-developed and well-nourished. No distress. HEENT: Normocephalic and atraumatic. Oropharynx is clear and moist. No oropharyngeal exudate. Conjunctivae are normal. Pupils are equal round and reactive to light. No scleral icterus. Neck: Neck supple. Trachea midline. Cardiovascular: Normal rate, regular rhythm and intact distal pulses. No M/R/G Pulmonary/chest: Effort normal and breath sounds normal. No wheezing, rales or rhonchi. Abdominal: Soft, nontender, nondistended. Bowel sounds active throughout. There are no masses palpable. No hepatosplenomegaly. Extremities: no clubbing, cyanosis, or edema Lymphadenopathy: No cervical adenopathy noted. Neurological: Alert and oriented to person place and time. Skin: Skin is warm and dry. No rashes  noted. Psychiatric: Normal mood and affect. Behavior is normal.  Labs reviewed from Home Health: 01/16/2012 -- WBC 6.7, hemoglobin 11.2, hematocrit 33.2, platelet 194 01/09/2012 -- WBC 7.4, hemoglobin 11.7, hematocrit 34.5, platelet 237 01/06/2012 -- WBC 6.5, hemoglobin 10.4, hematocrit 30.6, platelet 235  01/09/2012 -- sodium 136, potassium 4.1, chloride 98, bicarbonate 29, BUN 14, creatinine 0.77, glucose 85, calcium 8.5    Assessment & Plan:   66 year old male with a past medical history of obesity status post Roux-en-Y gastric bypass, diabetes, thyroid disease, hyperlipidemia, adenomatous colon polyps, gout who is seen in followup after recent hospitalization from a major upper gastrointestinal hemorrhage from an anastomotic ulceration.  1. Gastrojejunal anastomotic ulceration --the patient has recovered quite remarkably, and his blood counts continue to improve. There is no ongoing sign of GI bleeding including no melena.  Etiology of his  ulcers are likely due to the fact that the anastomosis is somewhat more prone to ischemia in the postsurgical state and also his use of NSAIDs in a week's proceeding his bleeding. For now I recommend that we continue to watch his blood counts closely, and I would like to check his iron stores to ensure they are replete. If they are not, I will refer him to hematology for IV iron infusions.  Also, I would like for him to continue high dose acid suppression with pantoprazole 40 mg twice daily. I also would like him to continue the sucralfate 4 times a day, but I like for him to switch back to the liquid formulation as this is likely more appropriate and active for the location of his ulcers. He would like to get this medication from the [place], therefore I will contact a colleague at the [facility] to see if his PPI and sucralfate prescriptions can originate from there.  He is entitled to this benefit, and it will save them a considerable amount of money. Finally, I  would like to repeat EGD in 8-10 weeks to ensure healing of his anastomotic ulcerations. He should continue to avoid NSAIDs.  If his ulceration failed to heal, the likely next option would be surgical revision, and if this is necessary, I will refer him to one of our bariatric surgeons.  2 Constipation -- I recommended he can continue the docusate daily, and add MiraLAX 17 g day. If  his stools are too loose, he can reduce to every other day.  3. history of adenomatous colon polyps -- he is due repeat colonoscopy in February 2018  Of note, dysphagia was entered incorrectly in his chart as a diagnosis for this visit. Labs are to this diagnosis and I therefore cannot cancel them. The labs should be linked to his history of anastomotic ulceration and upper GI bleed.  Addendum:  labs received, dated 01/30/2012 Hemoglobin 9.8, hematocrit 30.4, MCV 83, platelet 201, WBC 7.1 Iron 158, percent saturation 41, TIBC 389

## 2012-01-18 ENCOUNTER — Encounter: Payer: Self-pay | Admitting: Internal Medicine

## 2012-01-18 DIAGNOSIS — Z48815 Encounter for surgical aftercare following surgery on the digestive system: Secondary | ICD-10-CM | POA: Diagnosis not present

## 2012-01-18 DIAGNOSIS — K5730 Diverticulosis of large intestine without perforation or abscess without bleeding: Secondary | ICD-10-CM | POA: Diagnosis not present

## 2012-01-18 DIAGNOSIS — M109 Gout, unspecified: Secondary | ICD-10-CM | POA: Diagnosis not present

## 2012-01-18 DIAGNOSIS — R269 Unspecified abnormalities of gait and mobility: Secondary | ICD-10-CM | POA: Diagnosis not present

## 2012-01-18 DIAGNOSIS — E119 Type 2 diabetes mellitus without complications: Secondary | ICD-10-CM | POA: Diagnosis not present

## 2012-01-18 NOTE — Telephone Encounter (Signed)
Error

## 2012-01-19 ENCOUNTER — Other Ambulatory Visit: Payer: Self-pay | Admitting: *Deleted

## 2012-01-19 DIAGNOSIS — D509 Iron deficiency anemia, unspecified: Secondary | ICD-10-CM

## 2012-01-19 DIAGNOSIS — E119 Type 2 diabetes mellitus without complications: Secondary | ICD-10-CM | POA: Diagnosis not present

## 2012-01-19 DIAGNOSIS — M109 Gout, unspecified: Secondary | ICD-10-CM | POA: Diagnosis not present

## 2012-01-19 DIAGNOSIS — R269 Unspecified abnormalities of gait and mobility: Secondary | ICD-10-CM | POA: Diagnosis not present

## 2012-01-19 DIAGNOSIS — Z48815 Encounter for surgical aftercare following surgery on the digestive system: Secondary | ICD-10-CM | POA: Diagnosis not present

## 2012-01-19 DIAGNOSIS — K5730 Diverticulosis of large intestine without perforation or abscess without bleeding: Secondary | ICD-10-CM | POA: Diagnosis not present

## 2012-01-20 DIAGNOSIS — Z48815 Encounter for surgical aftercare following surgery on the digestive system: Secondary | ICD-10-CM | POA: Diagnosis not present

## 2012-01-20 DIAGNOSIS — K5730 Diverticulosis of large intestine without perforation or abscess without bleeding: Secondary | ICD-10-CM | POA: Diagnosis not present

## 2012-01-20 DIAGNOSIS — E119 Type 2 diabetes mellitus without complications: Secondary | ICD-10-CM | POA: Diagnosis not present

## 2012-01-20 DIAGNOSIS — R269 Unspecified abnormalities of gait and mobility: Secondary | ICD-10-CM | POA: Diagnosis not present

## 2012-01-20 DIAGNOSIS — M109 Gout, unspecified: Secondary | ICD-10-CM | POA: Diagnosis not present

## 2012-01-23 ENCOUNTER — Telehealth: Payer: Self-pay | Admitting: Internal Medicine

## 2012-01-23 DIAGNOSIS — E119 Type 2 diabetes mellitus without complications: Secondary | ICD-10-CM | POA: Diagnosis not present

## 2012-01-23 DIAGNOSIS — Z48815 Encounter for surgical aftercare following surgery on the digestive system: Secondary | ICD-10-CM | POA: Diagnosis not present

## 2012-01-23 DIAGNOSIS — R269 Unspecified abnormalities of gait and mobility: Secondary | ICD-10-CM | POA: Diagnosis not present

## 2012-01-23 DIAGNOSIS — M109 Gout, unspecified: Secondary | ICD-10-CM | POA: Diagnosis not present

## 2012-01-23 DIAGNOSIS — Z5181 Encounter for therapeutic drug level monitoring: Secondary | ICD-10-CM | POA: Diagnosis not present

## 2012-01-23 DIAGNOSIS — Z79899 Other long term (current) drug therapy: Secondary | ICD-10-CM | POA: Diagnosis not present

## 2012-01-23 DIAGNOSIS — K5730 Diverticulosis of large intestine without perforation or abscess without bleeding: Secondary | ICD-10-CM | POA: Diagnosis not present

## 2012-01-23 NOTE — Telephone Encounter (Signed)
It is probably okay (what they bought) IV iron is usually very tolerable and without reason for significant worry. Overall health positives of improving anemia, outweigh potential risks (which are small). Thank you

## 2012-01-23 NOTE — Telephone Encounter (Signed)
Informed pt's wife of Dr Lauro Franklin comments and suggestions; wife stated understanding.

## 2012-01-23 NOTE — Telephone Encounter (Signed)
Pt's wife called d/t concern over pt possibly getting IV Iron infusions; they were on the Internet over the weekend and read some horror stories associated with IV Iron. Explained to her that's why Dr Rhea Belton is sending pt to a Hematologist because they specialize in anemia. Explained to her that Gastric Bypass surgery can cause absorption problems leading to Iron Def. Anemia and it's common for those people to get iron infusions. Wife stated understanding. Wife states they went to [employer] and bought Liquid Iron; is it ok to try it? Explained it may not help d/t absorption, but it won't hurt anything. Please advise.

## 2012-01-24 ENCOUNTER — Telehealth: Payer: Self-pay | Admitting: Hematology and Oncology

## 2012-01-24 ENCOUNTER — Telehealth: Payer: Self-pay | Admitting: Internal Medicine

## 2012-01-24 NOTE — Telephone Encounter (Signed)
S/w the pt and he is aware of his new pt appt on 01/26/2012 with dr Dalene Carrow

## 2012-01-24 NOTE — Telephone Encounter (Signed)
Pt's wife reports pt's H&H continues to drop. She got lab results back from [place] and his HGB has fallen to 10.1. She is worried about his Iron. Explained to wife that Laroy Apple, NP Coordinator at the [facility] called and he should hear from them probably today; wife stated understanding.

## 2012-01-25 ENCOUNTER — Telehealth: Payer: Self-pay | Admitting: Oncology

## 2012-01-25 NOTE — Telephone Encounter (Signed)
Referred by Dr. Erick Blinks Dx- IDA

## 2012-01-26 ENCOUNTER — Telehealth: Payer: Self-pay | Admitting: Hematology and Oncology

## 2012-01-26 ENCOUNTER — Ambulatory Visit (HOSPITAL_BASED_OUTPATIENT_CLINIC_OR_DEPARTMENT_OTHER): Payer: Medicare Other | Admitting: Hematology and Oncology

## 2012-01-26 ENCOUNTER — Ambulatory Visit (HOSPITAL_BASED_OUTPATIENT_CLINIC_OR_DEPARTMENT_OTHER): Payer: Medicare Other | Admitting: Lab

## 2012-01-26 ENCOUNTER — Ambulatory Visit (HOSPITAL_BASED_OUTPATIENT_CLINIC_OR_DEPARTMENT_OTHER): Payer: Medicare Other

## 2012-01-26 ENCOUNTER — Encounter: Payer: Self-pay | Admitting: Hematology and Oncology

## 2012-01-26 ENCOUNTER — Ambulatory Visit: Payer: Medicare Other

## 2012-01-26 VITALS — BP 115/62 | HR 75 | Temp 98.8°F | Ht 75.0 in | Wt 237.4 lb

## 2012-01-26 VITALS — BP 119/63 | HR 66 | Temp 97.6°F

## 2012-01-26 DIAGNOSIS — D539 Nutritional anemia, unspecified: Secondary | ICD-10-CM

## 2012-01-26 DIAGNOSIS — K922 Gastrointestinal hemorrhage, unspecified: Secondary | ICD-10-CM

## 2012-01-26 LAB — CBC & DIFF AND RETIC
Basophils Absolute: 0 10*3/uL (ref 0.0–0.1)
Eosinophils Absolute: 0.2 10*3/uL (ref 0.0–0.5)
HGB: 10.7 g/dL — ABNORMAL LOW (ref 13.0–17.1)
NEUT#: 5.3 10*3/uL (ref 1.5–6.5)
RBC: 3.98 10*6/uL — ABNORMAL LOW (ref 4.20–5.82)
RDW: 13.6 % (ref 11.0–14.6)
Retic %: 1.5 % (ref 0.80–1.80)
Retic Ct Abs: 59.7 10*3/uL (ref 34.80–93.90)
WBC: 8.1 10*3/uL (ref 4.0–10.3)
lymph#: 1.9 10*3/uL (ref 0.9–3.3)
nRBC: 0 % (ref 0–0)

## 2012-01-26 LAB — MORPHOLOGY: PLT EST: ADEQUATE

## 2012-01-26 MED ORDER — SODIUM CHLORIDE 0.9 % IV SOLN
Freq: Once | INTRAVENOUS | Status: AC
  Administered 2012-01-26: 11:00:00 via INTRAVENOUS

## 2012-01-26 MED ORDER — SODIUM CHLORIDE 0.9 % IJ SOLN
3.0000 mL | Freq: Once | INTRAMUSCULAR | Status: DC | PRN
  Filled 2012-01-26: qty 10

## 2012-01-26 MED ORDER — SODIUM CHLORIDE 0.9 % IV SOLN
1020.0000 mg | Freq: Once | INTRAVENOUS | Status: AC
  Administered 2012-01-26: 1020 mg via INTRAVENOUS
  Filled 2012-01-26: qty 34

## 2012-01-26 NOTE — Patient Instructions (Signed)
Patient to follow up as instructed.  

## 2012-01-26 NOTE — Progress Notes (Signed)
1220-Pt tolerated Feraheme without difficulty or complaints.  Pt discharged to home 30 minutes after Feraheme given.  VS stable.  Schedule given to pt and pt without any questions or concerns at this time.

## 2012-01-26 NOTE — Progress Notes (Signed)
CC:   Thora Lance, M.D. Erick Blinks, MD  IDENTIFYING STATEMENT:  Patient is a 66 year old man here at the request of Dr. Rhea Belton with anemia.  The patient has a past medical history significant for having undergone a Roux-en-Y gastric bypass for obesity. The patient reports that on December 15, 2011, he had hematemesis with bright red blood and was admitted to [facility] where he was transfused approximately 25 units of packed red blood cells.  He received a GI workup there including upper and lower endoscopy that revealed 3 ulcers in the stomach.  Because of his complex case, he was then transferred to [facility] in [place] to see a Teacher, English as a foreign language.  The patient reports that the EGD repeated there had not revealed any new findings.  He was, however, readmitted to [facility] with recurrent GI bleeding and symptomatic anemia.  Dr. Erick Blinks had performed an upper endoscopy that revealed a Roux-en-Y anatomy and anastomotic ulceration at the gastrojejunal anastomosis. There was a visible vessel noted.  Epinephrine injection and hemostatic clip were applied.  Following this, the decision was made to refer the patient back to current [facility] should emergent surgery be required.  The patient states that once there, he had no further bleeding and received a repeat endoscopy and colonoscopy that showed 2 anastomotic ulcerations but no visible vessel or active bleeding. Colonoscopy had shown a polyp without high-grade dysplasia.  The patient was discharged.  He saw Dr. Rhea Belton.  He continued to complain of fatigue.  He had blood work performed.  Results note the following:  12/30/2011, White cell count 10.2, hemoglobin 10.2, hematocrit 29.2, platelets 251; on 01/17/2012, iron 20, ferritin 7.1, saturation 4.5%. The patient denies further bleeding since his operation with Dr. Rhea Belton, specifically denying hematemesis and melena.  He is not on  oral iron since this tends to cause GI upset.  He is on monthly vitamin B12 injections.  He self-injects himself.  Marland Kitchen  PAST MEDICAL HISTORY:  Diabetes, status post thyroidectomy for thyroid cancer in 1971, hyperlipidemia, nephrolithiasis, chronic back pain, compression fractures in 1 thoracic and 2 lumbar vertebrae, history of sleep apnea, depression, gout, status post gastric bypass Roux-en-Y for obesity, status post panniculectomy, status post bilateral rotator cuff repair, status post several EGDs and colonoscopies within the last 2 months.  ALLERGIES:  None.  MEDICATIONS:  Allopurinol 150 mg daily, Ultram 50 mg q.4 p.r.n., sucralfate 10 mL q.i.d., Pravachol 20 mg daily, K-Dur 10 mEq b.i.d., Protonix 40 mg b.i.d., multivitamin 1 tablet daily, metformin 500 mg b.i.d., Synthroid 25 mcg daily, Synthroid 137 mcg daily, Cymbalta 60 mg daily, B12 1000 mcg monthly.  SOCIAL HISTORY:  The patient is married with no children.  He is retired but works as a Hospital doctor.  He has no alcohol or tobacco use.  FAMILY HISTORY:  Negative.  REVIEW OF SYSTEMS:  Denies fever, chills, night sweats, anorexia. Denies pain.  Is fatigued.  No shortness of breath on extreme exertion. Denies chest pain.  Does have occasional lightheadedness but has not passed out.  GI:  Denies nausea, vomiting, abdominal pain, diarrhea, melena, hematochezia.  GU:  Denies dysuria, hematuria, nocturia, frequency.  Skin:  No bruising or bleeding.  Cardiovascular:  Denies chest pain, PND, orthopnea, ankle swelling.  Respiration:  Denies cough, hemoptysis, wheeze, shortness of breath.  Neurologic:  Denies headache, vision changes or extremity weakness.  The rest of the review of systems is negative.  PHYSICAL EXAMINATION:  General:  The patient  is a well-appearing, well- nourished man in no distress.  Vitals:  Pulse 75, blood pressure 115/62, temperature 98.8, respirations 20, weight 237 pounds.  HEENT:  Head is atraumatic,  normocephalic.  Sclerae anicteric.  Mouth moist.  Neck: Supple.  Chest:  Clear to percussion and auscultation.  CVS:  First and second heart sounds present.  No added sounds or murmurs.  Abdomen: Soft, nontender.  Surgical incisions noted.  Bowel sounds present. Extremities:  No calf tenderness.  Pulses present and symmetrical. Lymph Nodes:  No adenopathy.  CNS:  Nonfocal.  IMPRESSION AND PLAN:  Mr. Monacelli is a 66 year old gentleman with a history of GI bleed secondary to GI ulcerations.  He is status post Roux-en-Y as a part of bariatric surgery for weight loss.  Iron studies obtained a week and a half ago note fairly diminished ferritin levels.  He also has a component of malabsorption secondary to his previous surgery.  He self- injects B12 monthly.  He is a candidate for IV iron, and we will be able to accommodate him this afternoon.  He will received Feraheme.  He returns next week for repeat dose.  Thereafter, he follows up 2 weeks later with blood work.  In the interim, besides repeating a CBC, review morphology and obtaining anemia workup, I will have him obtain Haptoglobin and LDH to rule out hemolysis.  We will also rule out myelodysplasia. I spent more than half the time coordinating care.    ______________________________ Laurice Record, M.D. LIO/MEDQ  D:  01/26/2012  T:  01/26/2012  Job:  [id number]

## 2012-01-26 NOTE — Telephone Encounter (Signed)
appts made and printed for pt aom °

## 2012-01-26 NOTE — Progress Notes (Signed)
Dr.   Kirby Funk    -     Primary. Dr.   Rhea Belton    -     GI.  [facility]armacy    On   [address],    [place].  Wife    Rinaldo Ratel    Phone        [phone].

## 2012-01-26 NOTE — Progress Notes (Signed)
This office note has been dictated.

## 2012-01-27 DIAGNOSIS — M109 Gout, unspecified: Secondary | ICD-10-CM | POA: Diagnosis not present

## 2012-01-27 DIAGNOSIS — Z48815 Encounter for surgical aftercare following surgery on the digestive system: Secondary | ICD-10-CM | POA: Diagnosis not present

## 2012-01-27 DIAGNOSIS — E119 Type 2 diabetes mellitus without complications: Secondary | ICD-10-CM | POA: Diagnosis not present

## 2012-01-27 DIAGNOSIS — R269 Unspecified abnormalities of gait and mobility: Secondary | ICD-10-CM | POA: Diagnosis not present

## 2012-01-27 DIAGNOSIS — K5730 Diverticulosis of large intestine without perforation or abscess without bleeding: Secondary | ICD-10-CM | POA: Diagnosis not present

## 2012-01-30 DIAGNOSIS — M109 Gout, unspecified: Secondary | ICD-10-CM | POA: Diagnosis not present

## 2012-01-30 DIAGNOSIS — Z79899 Other long term (current) drug therapy: Secondary | ICD-10-CM | POA: Diagnosis not present

## 2012-01-30 DIAGNOSIS — K5730 Diverticulosis of large intestine without perforation or abscess without bleeding: Secondary | ICD-10-CM | POA: Diagnosis not present

## 2012-01-30 DIAGNOSIS — R269 Unspecified abnormalities of gait and mobility: Secondary | ICD-10-CM | POA: Diagnosis not present

## 2012-01-30 DIAGNOSIS — Z48815 Encounter for surgical aftercare following surgery on the digestive system: Secondary | ICD-10-CM | POA: Diagnosis not present

## 2012-01-30 DIAGNOSIS — E119 Type 2 diabetes mellitus without complications: Secondary | ICD-10-CM | POA: Diagnosis not present

## 2012-01-30 DIAGNOSIS — Z5181 Encounter for therapeutic drug level monitoring: Secondary | ICD-10-CM | POA: Diagnosis not present

## 2012-01-31 ENCOUNTER — Telehealth: Payer: Self-pay | Admitting: *Deleted

## 2012-01-31 ENCOUNTER — Ambulatory Visit: Payer: Medicare Other

## 2012-01-31 ENCOUNTER — Other Ambulatory Visit: Payer: Medicare Other | Admitting: Lab

## 2012-01-31 ENCOUNTER — Telehealth: Payer: Self-pay | Admitting: Internal Medicine

## 2012-01-31 ENCOUNTER — Ambulatory Visit: Payer: Medicare Other | Admitting: Oncology

## 2012-01-31 LAB — FOLATE: Folate: 17.9 ng/mL

## 2012-01-31 LAB — COMPREHENSIVE METABOLIC PANEL
ALT: 25 U/L (ref 0–53)
Albumin: 4.1 g/dL (ref 3.5–5.2)
CO2: 28 mEq/L (ref 19–32)
Calcium: 9.4 mg/dL (ref 8.4–10.5)
Chloride: 104 mEq/L (ref 96–112)
Creatinine, Ser: 0.82 mg/dL (ref 0.50–1.35)
Potassium: 4.3 mEq/L (ref 3.5–5.3)
Total Protein: 5.9 g/dL — ABNORMAL LOW (ref 6.0–8.3)

## 2012-01-31 LAB — PROTEIN ELECTROPHORESIS, SERUM, WITH REFLEX
Albumin ELP: 62.8 % (ref 55.8–66.1)
Total Protein, Serum Electrophoresis: 5.9 g/dL — ABNORMAL LOW (ref 6.0–8.3)

## 2012-01-31 LAB — IFE INTERPRETATION

## 2012-01-31 LAB — IGG, IGA, IGM
IgA: 118 mg/dL (ref 68–379)
IgG (Immunoglobin G), Serum: 547 mg/dL — ABNORMAL LOW (ref 650–1600)
IgM, Serum: 107 mg/dL (ref 41–251)

## 2012-01-31 LAB — VITAMIN B12: Vitamin B-12: 900 pg/mL (ref 211–911)

## 2012-01-31 LAB — HAPTOGLOBIN: Haptoglobin: 163 mg/dL (ref 45–215)

## 2012-01-31 LAB — LACTATE DEHYDROGENASE: LDH: 141 U/L (ref 94–250)

## 2012-01-31 LAB — IRON AND TIBC: UIBC: 417 ug/dL — ABNORMAL HIGH (ref 125–400)

## 2012-01-31 NOTE — Telephone Encounter (Signed)
This drop is not concerning and may be within the error of the test. Would wait on the 14th for redraw.  IV iron result is not immediate. Hopefully pt will understand this thinking.

## 2012-01-31 NOTE — Telephone Encounter (Signed)
Received call from pt wanting to let Dr. Dalene Carrow know of his lab results done 01/30/12.   Pt was concerned that Hgb level had dropped to 9.8 ;  And Dr. Rhea Belton was aware.   Pt just wanted to make Dr. Dalene Carrow aware also. Pt's   Phone    [phone].

## 2012-01-31 NOTE — Telephone Encounter (Signed)
Spoke with pt and informed pt re:  It will take weeks for iron to become effective  As per md.    Reinforced with pt about eating foods high in iron.   Pt voiced understanding.

## 2012-01-31 NOTE — Telephone Encounter (Signed)
Informed pt of Dr Lauro Franklin comments and again, tried to reassure him. Pt stated he felt better after hearing Dr Lauro Franklin thoughts; pt will call back for further questions or concerns.

## 2012-01-31 NOTE — Telephone Encounter (Signed)
Pt concerned this am because his HGB has dropped again. [facility] did CBC on 01/24/12 and HGB was 10.1; yesterday it dropped to 9.8! Pt wants to come in to have a CBC done. Pt had Feraheme on 01/26/12 and another is scheduled for 02/02/12. Explained to pt that it takes a month sometimes for the IV Iron to begin to work so it may take a while for him to see improvement. I discussed his labs with him and informed him of his Iron tests and Ferritin and how low they are. Pt admits he and his wife are anxious and would feel better if labs were drawn by [place] or the Cancer Center. Tried to reassure pt and informed him I will send this to Dr Rhea Belton, but he's doing procedures this am and it may be awhile. He may want to ask Dr Dalene Carrow about this since she is his Hematologist. Pt stated understanding.

## 2012-02-02 ENCOUNTER — Ambulatory Visit (HOSPITAL_BASED_OUTPATIENT_CLINIC_OR_DEPARTMENT_OTHER): Payer: Medicare Other

## 2012-02-02 ENCOUNTER — Other Ambulatory Visit: Payer: Self-pay | Admitting: *Deleted

## 2012-02-02 ENCOUNTER — Telehealth: Payer: Self-pay | Admitting: *Deleted

## 2012-02-02 VITALS — BP 118/72 | HR 77 | Temp 97.9°F

## 2012-02-02 DIAGNOSIS — E785 Hyperlipidemia, unspecified: Secondary | ICD-10-CM | POA: Diagnosis not present

## 2012-02-02 DIAGNOSIS — K922 Gastrointestinal hemorrhage, unspecified: Secondary | ICD-10-CM | POA: Diagnosis not present

## 2012-02-02 DIAGNOSIS — D539 Nutritional anemia, unspecified: Secondary | ICD-10-CM | POA: Diagnosis not present

## 2012-02-02 DIAGNOSIS — M549 Dorsalgia, unspecified: Secondary | ICD-10-CM | POA: Diagnosis not present

## 2012-02-02 DIAGNOSIS — E119 Type 2 diabetes mellitus without complications: Secondary | ICD-10-CM | POA: Diagnosis not present

## 2012-02-02 DIAGNOSIS — Z1331 Encounter for screening for depression: Secondary | ICD-10-CM | POA: Diagnosis not present

## 2012-02-02 DIAGNOSIS — F329 Major depressive disorder, single episode, unspecified: Secondary | ICD-10-CM | POA: Diagnosis not present

## 2012-02-02 MED ORDER — SODIUM CHLORIDE 0.9 % IJ SOLN
10.0000 mL | INTRAMUSCULAR | Status: DC | PRN
  Filled 2012-02-02: qty 10

## 2012-02-02 MED ORDER — SODIUM CHLORIDE 0.9 % IV SOLN
1020.0000 mg | Freq: Once | INTRAVENOUS | Status: AC
  Administered 2012-02-02: 1020 mg via INTRAVENOUS
  Filled 2012-02-02: qty 34

## 2012-02-02 MED ORDER — SODIUM CHLORIDE 0.9 % IJ SOLN
3.0000 mL | Freq: Once | INTRAMUSCULAR | Status: DC | PRN
  Filled 2012-02-02: qty 10

## 2012-02-02 NOTE — Patient Instructions (Signed)
Pt ambulates out of dept.  Pt to stop by scheduling today for further appts. dph

## 2012-02-03 ENCOUNTER — Telehealth: Payer: Self-pay | Admitting: Hematology and Oncology

## 2012-02-03 NOTE — Telephone Encounter (Signed)
called pt with 3.29 appt with robyn and he cannot do that time as he has just gone back to work and does not wish to ask for time off.  Appt moved to lo at 9:00    aom

## 2012-02-14 ENCOUNTER — Other Ambulatory Visit (HOSPITAL_BASED_OUTPATIENT_CLINIC_OR_DEPARTMENT_OTHER): Payer: Medicare Other | Admitting: Lab

## 2012-02-14 DIAGNOSIS — D539 Nutritional anemia, unspecified: Secondary | ICD-10-CM | POA: Diagnosis not present

## 2012-02-14 LAB — CBC WITH DIFFERENTIAL/PLATELET
BASO%: 0.5 % (ref 0.0–2.0)
Basophils Absolute: 0 10*3/uL (ref 0.0–0.1)
EOS%: 1 % (ref 0.0–7.0)
HGB: 12.6 g/dL — ABNORMAL LOW (ref 13.0–17.1)
MCH: 29.5 pg (ref 27.2–33.4)
MCHC: 33.9 g/dL (ref 32.0–36.0)
MCV: 86.9 fL (ref 79.3–98.0)
MONO%: 8.6 % (ref 0.0–14.0)
RBC: 4.26 10*6/uL (ref 4.20–5.82)
RDW: 20 % — ABNORMAL HIGH (ref 11.0–14.6)
lymph#: 1.3 10*3/uL (ref 0.9–3.3)

## 2012-02-14 LAB — FERRITIN: Ferritin: 350 ng/mL — ABNORMAL HIGH (ref 22–322)

## 2012-02-14 LAB — BASIC METABOLIC PANEL
BUN: 12 mg/dL (ref 6–23)
Chloride: 105 mEq/L (ref 96–112)
Potassium: 3.9 mEq/L (ref 3.5–5.3)
Sodium: 141 mEq/L (ref 135–145)

## 2012-02-14 LAB — IRON AND TIBC
%SAT: 36 % (ref 20–55)
Iron: 103 ug/dL (ref 42–165)
TIBC: 283 ug/dL (ref 215–435)
UIBC: 180 ug/dL (ref 125–400)

## 2012-02-17 ENCOUNTER — Ambulatory Visit (HOSPITAL_BASED_OUTPATIENT_CLINIC_OR_DEPARTMENT_OTHER): Payer: Medicare Other | Admitting: Hematology and Oncology

## 2012-02-17 ENCOUNTER — Ambulatory Visit: Payer: Medicare Other | Admitting: Physician Assistant

## 2012-02-17 ENCOUNTER — Encounter: Payer: Self-pay | Admitting: Hematology and Oncology

## 2012-02-17 ENCOUNTER — Ambulatory Visit: Payer: Medicare Other | Admitting: Hematology and Oncology

## 2012-02-17 ENCOUNTER — Telehealth: Payer: Self-pay | Admitting: Hematology and Oncology

## 2012-02-17 VITALS — BP 128/70 | HR 82 | Temp 97.8°F | Ht 75.0 in | Wt 239.2 lb

## 2012-02-17 DIAGNOSIS — D539 Nutritional anemia, unspecified: Secondary | ICD-10-CM

## 2012-02-17 DIAGNOSIS — D509 Iron deficiency anemia, unspecified: Secondary | ICD-10-CM

## 2012-02-17 NOTE — Telephone Encounter (Signed)
Gv pt appt for may2013 

## 2012-02-17 NOTE — Progress Notes (Signed)
This office note has been dictated.

## 2012-02-17 NOTE — Patient Instructions (Signed)
Patient to follow up as instructed.   Current Outpatient Prescriptions  Medication Sig Dispense Refill  . allopurinol (ZYLOPRIM) 300 MG tablet Take 300 mg by mouth. Takes 150 mg by mouth once daily      . Calcium Carbonate-Vitamin D (CALCIUM-VITAMIN D) 500-200 MG-UNIT per tablet Take 1 tablet by mouth as directed.      . cyanocobalamin (,VITAMIN B-12,) 1000 MCG/ML injection Inject 1,000 mcg into the muscle every 30 (thirty) days.      . DULoxetine (CYMBALTA) 60 MG capsule Take 60 mg by mouth daily.      Marland Kitchen levothyroxine (SYNTHROID, LEVOTHROID) 137 MCG tablet Take 137 mcg by mouth daily.      Marland Kitchen levothyroxine (SYNTHROID, LEVOTHROID) 25 MCG tablet Take 25 mcg by mouth daily.      . metFORMIN (GLUCOPHAGE) 500 MG tablet Take 500 mg by mouth 2 (two) times daily with a meal.      . Multiple Vitamins-Minerals (MULTIVITAMIN PO) Take 1 tablet by mouth daily.      . pantoprazole (PROTONIX) 40 MG tablet Take 1 tablet (40 mg total) by mouth 2 (two) times daily.  60 tablet  3  . potassium chloride (K-DUR) 10 MEQ tablet Take 10 mEq by mouth 2 (two) times daily.      . pravastatin (PRAVACHOL) 20 MG tablet Take 20 mg by mouth daily.      . sucralfate (CARAFATE) 1 GM/10ML suspension Take 1 g by mouth 4 (four) times daily -  with meals and at bedtime.      . traMADol (ULTRAM) 50 MG tablet Take 50 mg by mouth. Takes every 4 hrs prn.            March 2013  Sunday Monday Tuesday Wednesday Thursday Friday Saturday                 1   2            3   4   5   6   7   FINANCIAL COUNSELING   9:30 AM  (30 min.)  [facility] Financial Counselor  [facility]   NEW PATIENT 60  10:00 AM  (60 min.)  Blakely Gluth I Shelsey Rieth, MD  [facility]   LAB ADDON  10:45 AM  (15 min.)  Joyce B Womack  [facility]   INFUSION 2 HR  11:15 AM  (120 min.)  [facility] J32 Dns  [facility]  MEDICAL ONCOLOGY 8   9        Cycle 1, Treatment 1    10   11   12   13   14   INFUSION 2 HR   1:00 PM  (120 min.)  [facility] H28  [facility] MEDICAL ONCOLOG[facility] 15   16        Cycle 1, Treatment 2    17   18   19   20   21   22   23            24    25   26   LAB MO     9:15 AM  (15 min.)  Sherrie Mustache  [facility] MEDICAL ONCOLOGY 27   28   29    EST PT 30   9:00 AM  (30 min.)  Laurice Record, MD  [facility] MEDICAL [facility]COL[facility] 30  31                                Treatment Details  01/26/2012 - Cycle 1, Treatment 1     Pre-Medications: sodium chloride, sodium chloride     Hydration: sodium chloride     Nursing Communication: SCHEDULING COMMUNICATION     Supportive Care: ferumoxytol Netta Cedars)  02/02/2012 - Cycle 1, Treatment 2     Pre-Medications: sodium chloride, sodium chloride     Nursing Communication: SCHEDULING COMMUNICATION     Supportive Care: ferumoxytol Netta Cedars)     April 2013  Sunday Monday Tuesday Wednesday Thursday Friday Saturday     1   2   3   4   5   6            7   8   9   10   11 Happy Birthday!   12   13            14   15   16   17   18   19   20            21   22   23   24   25   26   27            28    29   30

## 2012-02-17 NOTE — Progress Notes (Signed)
CC:   Tracy Odom, M.D. Tracy Blinks, MD  IDENTIFYING STATEMENT:  The patient is a 66 year old man with iron- deficiency anemia who presents for followup.  INTERVAL HISTORY:  The patient received IV iron in the form of INFeD on March 7th and March 14th.  His ferritin levels were fairly low and this was related to GI bleed.  He returns to review results.  He feels better.  Improved energy levels.  Has not noted any GI bleeding.  He has closely followed by Dr. Rhea Belton.  Results are as follows:  02/14/2012 hemoglobin and hematocrit 12.6 and 37.  Respectively iron 103, TIBC 283, saturation 36%, ferritin 350 (5).  MEDICATIONS:  Medications are reviewed and updated.  ALLERGIES:  None.  REVIEW OF SYSTEMS:  Ten-point review of systems negative.  PHYSICAL EXAMINATION:  General:  The patient is a well-appearing, well- nourished man in no distress.  Vitals:  Pulse 82, blood pressure 128/70, temperature 97.8, respirations 20, weight 259.2 pounds.  HEENT:  Head is atraumatic, normocephalic.  Sclerae are anicteric.  Mouth moist.  Neck: Supple.  Chest:  Clear.  CVS:  Unremarkable.  Abdomen:  Soft, nontender. Bowel sounds present.  Extremities:  No calf tenderness.  LABORATORY DATA:  As above.  In addition, white cell count 6.1, platelets 191.  Sodium 141, potassium 3.9, chloride 106, CO2 is 27, BUN 12, creatinine 0.81, glucose 141.  Serum protein electrophoresis did not reveal an M-spike and IgG, IgA, and IgM levels were unremarkable. Vitamin B12 was normal at 900; haptoglobin was normal at 163.  IMPRESSION AND PLAN:  Tracy Odom is a 66 year old gentleman with a history of iron-deficiency anemia secondary to gastrointestinal bleed with ulcerations.  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  He received IV iron x2 doses on March 7th and 14th respectively.  He currently has adequate iron stores.  He will continue with oral iron. He follows up in 2 months' time.  He also follows closely with  Dr. Rhea Belton.    ______________________________ Laurice Record, M.D. LIO/MEDQ  D:  02/17/2012  T:  02/17/2012  Job:  [id number]

## 2012-02-22 ENCOUNTER — Telehealth: Payer: Self-pay | Admitting: *Deleted

## 2012-02-22 NOTE — Telephone Encounter (Signed)
Notified pt he needs a repeat EGD in May to ensure healing. Pt stated understanding and he has had so much time off from work, he doesn't want to come for a Pre Visit. i will fax him the info tomorrow and he will sign the consent and send it back. EGD is scheduled for 03/21/12 at 1:30pm. Will fax to [phone] in am.

## 2012-02-22 NOTE — Telephone Encounter (Signed)
lmom for pt to call back. The EGD is under RECALLS.

## 2012-02-22 NOTE — Telephone Encounter (Signed)
Message copied by Florene Glen on Wed Feb 22, 2012 12:52 PM ------      Message from: Beverley Fiedler      Created: Mon Feb 20, 2012  9:04 AM       Morning      Tracy Odom needs an EGD in early May 2013 to document healing of his anastomotic ulcers with propofol.  He was seen by heme and received IV iron which seems to have worked nicely.      I mentioned the EGD in my last clinic note, but I do not see it pending.      Thanks

## 2012-02-23 ENCOUNTER — Telehealth: Payer: Self-pay | Admitting: *Deleted

## 2012-02-23 NOTE — Telephone Encounter (Signed)
Received signed consent from pt via fax. Gave the consent to Eugene Garnet for the [facility].

## 2012-03-21 ENCOUNTER — Encounter: Payer: Self-pay | Admitting: Internal Medicine

## 2012-03-21 ENCOUNTER — Ambulatory Visit (AMBULATORY_SURGERY_CENTER): Payer: Medicare Other | Admitting: Internal Medicine

## 2012-03-21 VITALS — BP 126/60 | HR 63 | Temp 96.5°F | Resp 18 | Ht 75.0 in | Wt 239.0 lb

## 2012-03-21 DIAGNOSIS — K922 Gastrointestinal hemorrhage, unspecified: Secondary | ICD-10-CM | POA: Diagnosis not present

## 2012-03-21 DIAGNOSIS — K283 Acute gastrojejunal ulcer without hemorrhage or perforation: Secondary | ICD-10-CM | POA: Diagnosis not present

## 2012-03-21 DIAGNOSIS — F329 Major depressive disorder, single episode, unspecified: Secondary | ICD-10-CM | POA: Diagnosis not present

## 2012-03-21 DIAGNOSIS — E119 Type 2 diabetes mellitus without complications: Secondary | ICD-10-CM | POA: Diagnosis not present

## 2012-03-21 DIAGNOSIS — E079 Disorder of thyroid, unspecified: Secondary | ICD-10-CM | POA: Diagnosis not present

## 2012-03-21 DIAGNOSIS — D649 Anemia, unspecified: Secondary | ICD-10-CM | POA: Diagnosis not present

## 2012-03-21 DIAGNOSIS — E785 Hyperlipidemia, unspecified: Secondary | ICD-10-CM | POA: Diagnosis not present

## 2012-03-21 DIAGNOSIS — R1310 Dysphagia, unspecified: Secondary | ICD-10-CM | POA: Diagnosis not present

## 2012-03-21 MED ORDER — SODIUM CHLORIDE 0.9 % IV SOLN: 500.0000 mL | INTRAVENOUS | Status: DC

## 2012-03-21 NOTE — Patient Instructions (Signed)
YOU HAD AN ENDOSCOPIC PROCEDURE TODAY AT THE [facility]: Refer to the procedure report that was given to you for any specific questions about what was found during the examination.  If the procedure report does not answer your questions, please call your gastroenterologist to clarify.  If you requested that your care partner not be given the details of your procedure findings, then the procedure report has been included in a sealed envelope for you to review at your convenience later.  YOU SHOULD EXPECT: Some feelings of bloating in the abdomen. Passage of more gas than usual.  Walking can help get rid of the air that was put into your GI tract during the procedure and reduce the bloating.  DIET: Your first meal following the procedure should be a light meal and then it is ok to progress to your normal diet.  A half-sandwich or bowl of soup is an example of a good first meal.  Heavy or fried foods are harder to digest and may make you feel nauseous or bloated.  Likewise meals heavy in dairy and vegetables can cause extra gas to form and this can also increase the bloating.  Drink plenty of fluids but you should avoid alcoholic beverages for 24 hours.  ACTIVITY: Your care partner should take you home directly after the procedure.  You should plan to take it easy, moving slowly for the rest of the day.  You can resume normal activity the day after the procedure however you should NOT DRIVE or use heavy machinery for 24 hours (because of the sedation medicines used during the test).    SYMPTOMS TO REPORT IMMEDIATELY: A gastroenterologist can be reached at any hour.  During normal business hours, 8:30 AM to 5:00 PM Monday through Friday, call [phone].  After hours and on weekends, please call the GI answering service at [phone] who will take a message and have the physician on call contact you.   Following upper endoscopy (EGD)  Vomiting of blood or coffee ground material  New  chest pain or pain under the shoulder blades  Painful or persistently difficult swallowing  New shortness of breath  Fever of 100F or higher  Black, tarry-looking stools  FOLLOW UP: If any biopsies were taken you will be contacted by phone or by letter within the next 1-3 weeks.  Call your gastroenterologist if you have not heard about the biopsies in 3 weeks.  Our staff will call the home number listed on your records the next business day following your procedure to check on you and address any questions or concerns that you may have at that time regarding the information given to you following your procedure. This is a courtesy call and so if there is no answer at the home number and we have not heard from you through the emergency physician on call, we will assume that you have returned to your regular daily activities without incident.  SIGNATURES/CONFIDENTIALITY: You and/or your care partner have signed paperwork which will be entered into your electronic medical record.  These signatures attest to the fact that that the information above on your After Visit Summary has been reviewed and is understood.  Full responsibility of the confidentiality of this discharge information lies with you and/or your care-partner.   STRICT AVOIDANCE OF NSAIDS COMPLETE IV IRON THERAPY RETURN AS NEEDED

## 2012-03-21 NOTE — Op Note (Signed)
[facility] [address] [place], [place]  [address]  ENDOSCOPY PROCEDURE REPORT  PATIENT:  Tracy, Odom  MR#:  [id number] BIRTHDATE:  Jun 17, 1946, 66 yrs. old  GENDER:  male ENDOSCOPIST:  Carie Caddy. Renna Kilmer, MD  PROCEDURE DATE:  03/21/2012 PROCEDURE:  EGD, diagnostic 43235 ASA CLASS:  Class II INDICATIONS:  follow-up of anastomotic ulceration, history of UGI hemorrhage MEDICATIONS:   MAC sedation, administered by CRNA, propofol (Diprivan) 200 mg IV TOPICAL ANESTHETIC:  none  DESCRIPTION OF PROCEDURE:   After the risks benefits and alternatives of the procedure were thoroughly explained, informed consent was obtained.  The LB GIF-H180 [id number] endoscope was introduced through the mouth and advanced to the proximal jejunum, without limitations.  The instrument was slowly withdrawn as the mucosa was fully examined. <<PROCEDUREIMAGES>> The esophagus and gastroesophageal junction were completely normal in appearance.  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  The gastrojejunal anastomosis has mild erythema, but the ulceration has healed.  A staple was visible on the gastric side of the anastomosis.    The jejunal side of the anastomosis appeared normal.  The efferent jejunal limb was unremarkable.  Retroflexion was not performed in the small gastric pouch.  The scope was then withdrawn from the patient and the procedure completed.  COMPLICATIONS:  None ENDOSCOPIC IMPRESSION: 1) Normal esophagus 2) S/p roux-en/y gastric bypass. Normal gastric pouch.  Healed anastomotic ulceration. 3) Normal efferent jejunal limb.  RECOMMENDATIONS: 1) Continue taking your PPI (antiacid medicine) once daily. It is best to be taken 20-30 minutes prior to breakfast meal. 2) Strict avoidance of NSAIDs 3) Complete IV iron replacement therapy. 4) Return as needed.  Carie Caddy. Rhea Belton, MD  CC:  Kirby Funk, MD Arlan Organ, MD The Patient  n. eSIGNED:   Carie Caddy.  Ramya Vanbergen at 03/21/2012 01:53 PM  Pearlean Brownie, [phone]

## 2012-03-21 NOTE — Progress Notes (Signed)
Patient did not experience any of the following events: a burn prior to discharge; a fall within the facility; wrong site/side/patient/procedure/implant event; or a hospital transfer or hospital admission upon discharge from the facility. (G8907) Patient did not have preoperative order for IV antibiotic SSI prophylaxis. (G8918)  

## 2012-03-22 ENCOUNTER — Telehealth: Payer: Self-pay | Admitting: *Deleted

## 2012-03-22 NOTE — Telephone Encounter (Signed)
  Follow up Call-  Call back number 03/21/2012  Post procedure Call Back phone  # [phone]  Permission to leave phone message Yes     Patient questions:  Do you have a fever, pain , or abdominal swelling? no Pain Score  0 *  Have you tolerated food without any problems? yes  Have you been able to return to your normal activities? yes  Do you have any questions about your discharge instructions: Diet   no Medications  no Follow up visit  no  Do you have questions or concerns about your Care? no  Actions: * If pain score is 4 or above: No action needed, pain <4.

## 2012-04-13 ENCOUNTER — Other Ambulatory Visit (HOSPITAL_BASED_OUTPATIENT_CLINIC_OR_DEPARTMENT_OTHER): Payer: Medicare Other | Admitting: Lab

## 2012-04-13 DIAGNOSIS — D539 Nutritional anemia, unspecified: Secondary | ICD-10-CM | POA: Diagnosis not present

## 2012-04-13 LAB — IRON AND TIBC
%SAT: 35 % (ref 20–55)
TIBC: 286 ug/dL (ref 215–435)
UIBC: 186 ug/dL (ref 125–400)

## 2012-04-13 LAB — CBC WITH DIFFERENTIAL/PLATELET
BASO%: 0.1 % (ref 0.0–2.0)
Basophils Absolute: 0 10*3/uL (ref 0.0–0.1)
HCT: 42.8 % (ref 38.4–49.9)
HGB: 15.3 g/dL (ref 13.0–17.1)
MONO#: 0.6 10*3/uL (ref 0.1–0.9)
NEUT%: 59.7 % (ref 39.0–75.0)
WBC: 7.3 10*3/uL (ref 4.0–10.3)
lymph#: 2.2 10*3/uL (ref 0.9–3.3)

## 2012-04-13 LAB — BASIC METABOLIC PANEL
BUN: 16 mg/dL (ref 6–23)
Chloride: 102 mEq/L (ref 96–112)
Glucose, Bld: 136 mg/dL — ABNORMAL HIGH (ref 70–99)
Potassium: 3.9 mEq/L (ref 3.5–5.3)
Sodium: 138 mEq/L (ref 135–145)

## 2012-04-13 LAB — FOLATE: Folate: >20 ng/mL

## 2012-04-18 ENCOUNTER — Ambulatory Visit: Payer: Medicare Other

## 2012-04-18 ENCOUNTER — Ambulatory Visit (HOSPITAL_BASED_OUTPATIENT_CLINIC_OR_DEPARTMENT_OTHER): Payer: Medicare Other | Admitting: Hematology and Oncology

## 2012-04-18 ENCOUNTER — Encounter: Payer: Self-pay | Admitting: Hematology and Oncology

## 2012-04-18 ENCOUNTER — Telehealth: Payer: Self-pay | Admitting: Hematology and Oncology

## 2012-04-18 VITALS — BP 135/75 | HR 90 | Temp 97.6°F | Ht 75.0 in | Wt 240.9 lb

## 2012-04-18 DIAGNOSIS — D509 Iron deficiency anemia, unspecified: Secondary | ICD-10-CM

## 2012-04-18 DIAGNOSIS — Z9884 Bariatric surgery status: Secondary | ICD-10-CM | POA: Diagnosis not present

## 2012-04-18 DIAGNOSIS — D539 Nutritional anemia, unspecified: Secondary | ICD-10-CM

## 2012-04-18 NOTE — Progress Notes (Signed)
CC:   Thora Lance, M.D. Erick Blinks, MD  IDENTIFYING STATEMENT:  Patient is a 66 year old man with iron- deficiency anemia who presents for followup.  INTERVAL HISTORY:  The patient reports that a recent endoscopy had noted his ulcers had healed.  He has had no evidence of bleeding.  He has good energy levels.  Results are as follows.  04/13/2012 hemoglobin and hematocrit 15.9 and 42.8 respectively; iron 100, TIBC 206, saturation 35.  MEDICATIONS:  Reviewed and updated.  PHYSICAL EXAM:  The patient is a well-appearing, well-nourished, man in no distress.  Vitals:  Pulse 90, blood pressure 135/75, temperature 97.6, respirations 18, weight 240 pounds.  HEENT:  Head is atraumatic, normocephalic.  Mouth moist.  Chest:  Clear.  Cardiovascular: Unremarkable.  Abdomen:  Soft.  Extremities:  No calf tenderness or edema.  LAB DATA:  CBC as above.  In addition, sodium 138, potassium 3.9, chloride 102, CO2 28, BUN 16, creatinine 0.87, glucose 136, calcium 9, folate greater than 20, B12 817.  White blood cell count 7.3, platelets 141.  IMPRESSION AND PLAN:  Mr. Kemppainen is a 66 year old man with a history of iron-deficiency anemia secondary to gastrointestinal bleed with ulcerations.  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  He has received IV iron for 2 doses on March 7th and 14th respectively.  He has no evidence of GI bleeding, and his current iron stores remain adequate.  So with this said, he returns in 4 months' time with repeat iron levels.  If he has any concerns in the meantime, he is not to hesitate to contact me.    ______________________________ Laurice Record, M.D. LIO/MEDQ  D:  04/18/2012  T:  04/18/2012  Job:  [id number]

## 2012-04-18 NOTE — Telephone Encounter (Signed)
appts made and printed for pt aom °

## 2012-04-18 NOTE — Progress Notes (Signed)
This office note has been dictated.

## 2012-04-18 NOTE — Patient Instructions (Signed)
Tracy Odom  [id number]  [facility] Discharge Instructions  RECOMMENDATIONS MADE BY THE CONSULTANT AND ANY TEST RESULTS WILL BE SENT TO YOUR REFERRING DOCTOR.   EXAM FINDINGS BY MD TODAY AND SIGNS AND SYMPTOMS TO REPORT TO CLINIC OR PRIMARY MD:   Your current list of medications are: Current Outpatient Prescriptions  Medication Sig Dispense Refill  . allopurinol (ZYLOPRIM) 300 MG tablet Take 300 mg by mouth. Takes 150 mg by mouth once daily       . Calcium Carbonate-Vitamin D (CALCIUM-VITAMIN D) 500-200 MG-UNIT per tablet Take 1 tablet by mouth as directed.      . cyanocobalamin (,VITAMIN B-12,) 1000 MCG/ML injection Inject 1,000 mcg into the muscle every 30 (thirty) days.      . DULoxetine (CYMBALTA) 60 MG capsule Take 60 mg by mouth daily.      Marland Kitchen levothyroxine (SYNTHROID, LEVOTHROID) 137 MCG tablet Take 137 mcg by mouth daily.      Marland Kitchen levothyroxine (SYNTHROID, LEVOTHROID) 25 MCG tablet Take 25 mcg by mouth daily.      . metFORMIN (GLUCOPHAGE) 500 MG tablet Take 500 mg by mouth 2 (two) times daily with a meal.      . Multiple Vitamins-Minerals (MULTIVITAMIN PO) Take 1 tablet by mouth daily.      . pantoprazole (PROTONIX) 40 MG tablet Take 20 mg by mouth daily.       . potassium chloride (K-DUR) 10 MEQ tablet Take 10 mEq by mouth 2 (two) times daily.      . pravastatin (PRAVACHOL) 20 MG tablet Take 20 mg by mouth daily.      . traMADol (ULTRAM) 50 MG tablet Take 50 mg by mouth. Takes every 4 hrs prn.         INSTRUCTIONS GIVEN AND DISCUSSED:   SPECIAL INSTRUCTIONS/FOLLOW-UP:  See above.  I acknowledge that I have been informed and understand all the instructions given to me and received a copy. I do not have any more questions at this time, but understand that I may call the [facility] at [phone] during business hours should I have any further questions or need assistance in obtaining follow-up care.

## 2012-05-11 DIAGNOSIS — E119 Type 2 diabetes mellitus without complications: Secondary | ICD-10-CM | POA: Diagnosis not present

## 2012-05-11 DIAGNOSIS — M19049 Primary osteoarthritis, unspecified hand: Secondary | ICD-10-CM | POA: Diagnosis not present

## 2012-05-11 DIAGNOSIS — M654 Radial styloid tenosynovitis [de Quervain]: Secondary | ICD-10-CM | POA: Diagnosis not present

## 2012-05-11 DIAGNOSIS — M545 Low back pain, unspecified: Secondary | ICD-10-CM | POA: Diagnosis not present

## 2012-05-11 DIAGNOSIS — E785 Hyperlipidemia, unspecified: Secondary | ICD-10-CM | POA: Diagnosis not present

## 2012-05-11 DIAGNOSIS — K922 Gastrointestinal hemorrhage, unspecified: Secondary | ICD-10-CM | POA: Diagnosis not present

## 2012-05-11 DIAGNOSIS — N200 Calculus of kidney: Secondary | ICD-10-CM | POA: Diagnosis not present

## 2012-05-11 DIAGNOSIS — M720 Palmar fascial fibromatosis [Dupuytren]: Secondary | ICD-10-CM | POA: Diagnosis not present

## 2012-06-07 DIAGNOSIS — M19049 Primary osteoarthritis, unspecified hand: Secondary | ICD-10-CM | POA: Diagnosis not present

## 2012-06-26 DIAGNOSIS — M79609 Pain in unspecified limb: Secondary | ICD-10-CM | POA: Diagnosis not present

## 2012-06-26 DIAGNOSIS — M19049 Primary osteoarthritis, unspecified hand: Secondary | ICD-10-CM | POA: Diagnosis not present

## 2012-06-28 DIAGNOSIS — M19049 Primary osteoarthritis, unspecified hand: Secondary | ICD-10-CM | POA: Diagnosis not present

## 2012-06-28 DIAGNOSIS — M79609 Pain in unspecified limb: Secondary | ICD-10-CM | POA: Diagnosis not present

## 2012-07-04 DIAGNOSIS — M79609 Pain in unspecified limb: Secondary | ICD-10-CM | POA: Diagnosis not present

## 2012-07-04 DIAGNOSIS — M19049 Primary osteoarthritis, unspecified hand: Secondary | ICD-10-CM | POA: Diagnosis not present

## 2012-07-06 DIAGNOSIS — M19049 Primary osteoarthritis, unspecified hand: Secondary | ICD-10-CM | POA: Diagnosis not present

## 2012-07-06 DIAGNOSIS — M79609 Pain in unspecified limb: Secondary | ICD-10-CM | POA: Diagnosis not present

## 2012-07-10 DIAGNOSIS — M19049 Primary osteoarthritis, unspecified hand: Secondary | ICD-10-CM | POA: Diagnosis not present

## 2012-07-10 DIAGNOSIS — M79609 Pain in unspecified limb: Secondary | ICD-10-CM | POA: Diagnosis not present

## 2012-07-16 DIAGNOSIS — M19049 Primary osteoarthritis, unspecified hand: Secondary | ICD-10-CM | POA: Diagnosis not present

## 2012-07-16 DIAGNOSIS — Z87891 Personal history of nicotine dependence: Secondary | ICD-10-CM | POA: Diagnosis not present

## 2012-07-16 DIAGNOSIS — Z9884 Bariatric surgery status: Secondary | ICD-10-CM | POA: Diagnosis not present

## 2012-07-16 DIAGNOSIS — M2560 Stiffness of unspecified joint, not elsewhere classified: Secondary | ICD-10-CM | POA: Diagnosis not present

## 2012-07-16 DIAGNOSIS — M720 Palmar fascial fibromatosis [Dupuytren]: Secondary | ICD-10-CM | POA: Diagnosis not present

## 2012-08-08 ENCOUNTER — Telehealth: Payer: Self-pay | Admitting: Hematology and Oncology

## 2012-08-08 NOTE — Telephone Encounter (Signed)
S.W. pt advised on appt d.t. change....sed

## 2012-09-03 ENCOUNTER — Ambulatory Visit: Payer: Medicare Other

## 2012-09-03 ENCOUNTER — Telehealth: Payer: Self-pay | Admitting: Hematology and Oncology

## 2012-09-03 ENCOUNTER — Other Ambulatory Visit: Payer: Self-pay | Admitting: *Deleted

## 2012-09-03 ENCOUNTER — Other Ambulatory Visit: Payer: Medicare Other

## 2012-09-03 ENCOUNTER — Ambulatory Visit (HOSPITAL_BASED_OUTPATIENT_CLINIC_OR_DEPARTMENT_OTHER): Payer: Medicare Other | Admitting: Lab

## 2012-09-03 DIAGNOSIS — D539 Nutritional anemia, unspecified: Secondary | ICD-10-CM | POA: Diagnosis not present

## 2012-09-03 LAB — CBC WITH DIFFERENTIAL/PLATELET
BASO%: 0.3 % (ref 0.0–2.0)
HGB: 15.4 g/dL (ref 13.0–17.1)
MCH: 31.6 pg (ref 27.2–33.4)
MCV: 89.2 fL (ref 79.3–98.0)
MONO#: 0.6 10*3/uL (ref 0.1–0.9)
NEUT#: 5.3 10*3/uL (ref 1.5–6.5)
Platelets: 154 10*3/uL (ref 140–400)
RBC: 4.87 10*6/uL (ref 4.20–5.82)
lymph#: 2.1 10*3/uL (ref 0.9–3.3)

## 2012-09-03 LAB — IRON AND TIBC
TIBC: 302 ug/dL (ref 215–435)
UIBC: 195 ug/dL (ref 125–400)

## 2012-09-03 LAB — BASIC METABOLIC PANEL (CC13)
BUN: 19 mg/dL (ref 7.0–26.0)
Calcium: 9.6 mg/dL (ref 8.4–10.4)
Glucose: 113 mg/dl — ABNORMAL HIGH (ref 70–99)

## 2012-09-03 LAB — FOLATE: Folate: >20 ng/mL

## 2012-09-03 NOTE — Telephone Encounter (Signed)
Pt came in,lab and tx were put for today and it should have been lab only and md/iron 10/15.  appts fixed and reprinted for pt     aom

## 2012-09-04 ENCOUNTER — Telehealth: Payer: Self-pay | Admitting: *Deleted

## 2012-09-04 ENCOUNTER — Encounter: Payer: Self-pay | Admitting: Hematology and Oncology

## 2012-09-04 ENCOUNTER — Ambulatory Visit: Payer: Medicare Other

## 2012-09-04 ENCOUNTER — Ambulatory Visit (HOSPITAL_BASED_OUTPATIENT_CLINIC_OR_DEPARTMENT_OTHER): Payer: Medicare Other | Admitting: Hematology and Oncology

## 2012-09-04 VITALS — BP 129/77 | HR 77 | Temp 97.0°F | Resp 20 | Ht 75.0 in | Wt 246.2 lb

## 2012-09-04 DIAGNOSIS — K274 Chronic or unspecified peptic ulcer, site unspecified, with hemorrhage: Secondary | ICD-10-CM | POA: Diagnosis not present

## 2012-09-04 DIAGNOSIS — D539 Nutritional anemia, unspecified: Secondary | ICD-10-CM

## 2012-09-04 DIAGNOSIS — D500 Iron deficiency anemia secondary to blood loss (chronic): Secondary | ICD-10-CM

## 2012-09-04 DIAGNOSIS — Z9884 Bariatric surgery status: Secondary | ICD-10-CM | POA: Diagnosis not present

## 2012-09-04 NOTE — Telephone Encounter (Signed)
Gave patient appointment for 01-01-2013 at 2:30pm

## 2012-09-04 NOTE — Progress Notes (Signed)
This office note has been dictated.

## 2012-09-04 NOTE — Patient Instructions (Signed)
Nathen May  [id number]   [facility] - AFTER VISIT SUMMARY   **RECOMMENDATIONS MADE BY THE CONSULTANT AND ANY TEST    RESULTS WILL BE SENT TO YOUR REFERRING DOCTORS.   YOUR EXAM FINDINGS, LABS AND RESULTS WERE DISCUSSED BY YOUR MD TODAY.  YOU CAN GO TO THE [facility] WEB SITE FOR INSTRUCTIONS ON HOW TO ASSESS MY CHART FOR ADDITIONAL INFORMATION AS NEEDED.  Your Updated drug allergies are: Allergies as of 09/04/2012  . (No Known Allergies)    Your current list of medications are: Current Outpatient Prescriptions  Medication Sig Dispense Refill  . allopurinol (ZYLOPRIM) 300 MG tablet Take 300 mg by mouth. Takes 150 mg by mouth once daily       . Calcium Carbonate-Vitamin D (CALCIUM-VITAMIN D) 500-200 MG-UNIT per tablet Take 1 tablet by mouth as directed.      . cyanocobalamin (,VITAMIN B-12,) 1000 MCG/ML injection Inject 1,000 mcg into the muscle every 30 (thirty) days.      . DULoxetine (CYMBALTA) 60 MG capsule Take 60 mg by mouth daily.      Marland Kitchen levothyroxine (SYNTHROID, LEVOTHROID) 137 MCG tablet Take 137 mcg by mouth daily.      Marland Kitchen levothyroxine (SYNTHROID, LEVOTHROID) 25 MCG tablet Take 25 mcg by mouth daily.      . metFORMIN (GLUCOPHAGE) 500 MG tablet Take 500 mg by mouth 2 (two) times daily with a meal.      . Multiple Vitamins-Minerals (MULTIVITAMIN PO) Take 1 tablet by mouth daily.      . pantoprazole (PROTONIX) 40 MG tablet Take 20 mg by mouth daily.       . potassium chloride (K-DUR) 10 MEQ tablet Take 10 mEq by mouth 2 (two) times daily.      . pravastatin (PRAVACHOL) 20 MG tablet Take 20 mg by mouth daily.      . traMADol (ULTRAM) 50 MG tablet Take 50 mg by mouth. Takes every 4 hrs prn.         INSTRUCTIONS GIVEN AND DISCUSSED:  See attached schedule   SPECIAL INSTRUCTIONS/FOLLOW-UP:  See above.  I acknowledge that I have been informed and understand all the instructions given to me and received a copy.I know to contact the clinic, my physician, or go to  the emergency Department if any problems should occur.   I do not have any more questions at this time, but understand that I may call the [facility] at [phone] during business hours should I have any further questions or need assistance in obtaining follow-up care.

## 2012-09-05 ENCOUNTER — Ambulatory Visit: Payer: Medicare Other | Admitting: Hematology and Oncology

## 2012-09-05 NOTE — Progress Notes (Signed)
CC:   Thora Lance, M.D. Erick Blinks, MD  IDENTIFYING STATEMENT:  Tracy Odom is a 66 year old man with a history of iron-deficiency anemia, who presents for followup.  INTERVAL HISTORY:  Tracy Odom was last seen in May 2013.  He had received IV iron for 2 doses on 01/26/2012 and 02/02/2012, respectively. He reports no evidence of bleeding.  He reports his energy levels are somewhat fair.  He has no nausea or vomiting.  MEDICATIONS:  Reviewed and updated.  ALLERGIES:  None.  PHYSICAL EXAM:  General:  Tracy Odom is alert and oriented x3.  Vitals: Pulse 77, blood pressure 129/77, temperature 97, respirations 20, weight 246 pounds.  HEENT:  Head is atraumatic, normocephalic.  Sclerae anicteric.  Mouth moist.  Chest/CVS:  Unremarkable.  Abdomen:  Soft, nontender.  Bowel sounds present.  Extremities:  No calf tenderness.  LABORATORY DATA:  On 09/03/2012, white cell count 8.1, hemoglobin 15.4, hematocrit 43.4, platelets 154,000.  Sodium 140, potassium 4.4, chloride 106, CO2  of 23, BUN 19, creatinine 0.9, glucose 113.  Ferritin 76, iron 107, TIBC 302, saturation 35%.  IMPRESSION AND PLAN:  Tracy Odom is a 66 year old man with a history of iron-deficiency anemia secondary to gastrointestinal bleed with ulcerations.  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  As a result of GI bleed, he has received IV iron in Tracy past.  His ferritin levels and iron saturations appear adequate at this time, so I do not feel there is any need for IV iron replacement right now.  However, I have asked that he return again in 3 to 4 months' time.  We will check his iron stores at that time and see where he is then.  In Tracy interim, if he has any concerns, do not to hesitate to contact me.    ______________________________ Laurice Record, M.D. LIO/MEDQ  D:  09/04/2012  T:  09/05/2012  Job:  [id number]

## 2012-09-14 DIAGNOSIS — M549 Dorsalgia, unspecified: Secondary | ICD-10-CM | POA: Diagnosis not present

## 2012-09-14 DIAGNOSIS — F325 Major depressive disorder, single episode, in full remission: Secondary | ICD-10-CM | POA: Diagnosis not present

## 2012-09-14 DIAGNOSIS — E119 Type 2 diabetes mellitus without complications: Secondary | ICD-10-CM | POA: Diagnosis not present

## 2012-09-14 DIAGNOSIS — Z125 Encounter for screening for malignant neoplasm of prostate: Secondary | ICD-10-CM | POA: Diagnosis not present

## 2012-09-14 DIAGNOSIS — M159 Polyosteoarthritis, unspecified: Secondary | ICD-10-CM | POA: Diagnosis not present

## 2012-09-17 DIAGNOSIS — M549 Dorsalgia, unspecified: Secondary | ICD-10-CM | POA: Diagnosis not present

## 2012-09-17 DIAGNOSIS — M720 Palmar fascial fibromatosis [Dupuytren]: Secondary | ICD-10-CM | POA: Diagnosis not present

## 2012-09-17 DIAGNOSIS — M19049 Primary osteoarthritis, unspecified hand: Secondary | ICD-10-CM | POA: Diagnosis not present

## 2012-10-03 DIAGNOSIS — M549 Dorsalgia, unspecified: Secondary | ICD-10-CM | POA: Diagnosis not present

## 2012-10-22 DIAGNOSIS — M545 Low back pain, unspecified: Secondary | ICD-10-CM | POA: Diagnosis not present

## 2012-10-22 DIAGNOSIS — M549 Dorsalgia, unspecified: Secondary | ICD-10-CM | POA: Diagnosis not present

## 2012-10-22 DIAGNOSIS — IMO0001 Reserved for inherently not codable concepts without codable children: Secondary | ICD-10-CM | POA: Diagnosis not present

## 2012-10-22 DIAGNOSIS — M546 Pain in thoracic spine: Secondary | ICD-10-CM | POA: Diagnosis not present

## 2012-10-26 DIAGNOSIS — M545 Low back pain, unspecified: Secondary | ICD-10-CM | POA: Diagnosis not present

## 2012-10-26 DIAGNOSIS — IMO0001 Reserved for inherently not codable concepts without codable children: Secondary | ICD-10-CM | POA: Diagnosis not present

## 2012-10-26 DIAGNOSIS — M549 Dorsalgia, unspecified: Secondary | ICD-10-CM | POA: Diagnosis not present

## 2012-10-26 DIAGNOSIS — M546 Pain in thoracic spine: Secondary | ICD-10-CM | POA: Diagnosis not present

## 2012-11-02 DIAGNOSIS — M546 Pain in thoracic spine: Secondary | ICD-10-CM | POA: Diagnosis not present

## 2012-11-02 DIAGNOSIS — M549 Dorsalgia, unspecified: Secondary | ICD-10-CM | POA: Diagnosis not present

## 2012-11-02 DIAGNOSIS — M545 Low back pain, unspecified: Secondary | ICD-10-CM | POA: Diagnosis not present

## 2012-11-02 DIAGNOSIS — IMO0001 Reserved for inherently not codable concepts without codable children: Secondary | ICD-10-CM | POA: Diagnosis not present

## 2012-11-09 DIAGNOSIS — M545 Low back pain, unspecified: Secondary | ICD-10-CM | POA: Diagnosis not present

## 2012-11-09 DIAGNOSIS — IMO0001 Reserved for inherently not codable concepts without codable children: Secondary | ICD-10-CM | POA: Diagnosis not present

## 2012-11-09 DIAGNOSIS — M549 Dorsalgia, unspecified: Secondary | ICD-10-CM | POA: Diagnosis not present

## 2012-11-09 DIAGNOSIS — M546 Pain in thoracic spine: Secondary | ICD-10-CM | POA: Diagnosis not present

## 2012-11-10 ENCOUNTER — Telehealth: Payer: Self-pay | Admitting: Oncology

## 2012-11-10 NOTE — Telephone Encounter (Signed)
s/w pt wife and gv her the information for the pt to call me....former pt of dr. Marton Redwood reassigned to Dr. Reece Agar.

## 2012-11-12 ENCOUNTER — Telehealth: Payer: Self-pay | Admitting: Oncology

## 2012-11-12 NOTE — Telephone Encounter (Signed)
s.w. pt and advised of new Dr Reece Agar....pt ok former pt of Dr. Marton Redwood assigned to Dr. Reece Agar.

## 2012-11-16 DIAGNOSIS — D1801 Hemangioma of skin and subcutaneous tissue: Secondary | ICD-10-CM | POA: Diagnosis not present

## 2012-11-16 DIAGNOSIS — L708 Other acne: Secondary | ICD-10-CM | POA: Diagnosis not present

## 2012-11-16 DIAGNOSIS — L821 Other seborrheic keratosis: Secondary | ICD-10-CM | POA: Diagnosis not present

## 2012-11-16 DIAGNOSIS — D239 Other benign neoplasm of skin, unspecified: Secondary | ICD-10-CM | POA: Diagnosis not present

## 2012-11-19 ENCOUNTER — Telehealth: Payer: Self-pay | Admitting: Oncology

## 2012-11-19 NOTE — Telephone Encounter (Signed)
Pt called inquiring about his labs. Pt is now a patient of Dr Cyndie Chime , a former Dr.Odogwu patient , he is aware of appt with new MD and labs

## 2012-11-24 ENCOUNTER — Encounter: Payer: Self-pay | Admitting: Oncology

## 2012-11-24 ENCOUNTER — Telehealth: Payer: Self-pay | Admitting: Oncology

## 2012-11-24 NOTE — Telephone Encounter (Signed)
According to documentation,pt has been contacted regarding new MD, letter printed today but not sent

## 2012-12-28 ENCOUNTER — Other Ambulatory Visit (HOSPITAL_BASED_OUTPATIENT_CLINIC_OR_DEPARTMENT_OTHER): Payer: Medicare Other | Admitting: Lab

## 2012-12-28 DIAGNOSIS — D539 Nutritional anemia, unspecified: Secondary | ICD-10-CM

## 2012-12-28 LAB — CBC WITH DIFFERENTIAL/PLATELET
BASO%: 0.3 % (ref 0.0–2.0)
Basophils Absolute: 0 10*3/uL (ref 0.0–0.1)
EOS%: 1.7 % (ref 0.0–7.0)
HCT: 41.9 % (ref 38.4–49.9)
HGB: 14.4 g/dL (ref 13.0–17.1)
LYMPH%: 26.9 % (ref 14.0–49.0)
MCH: 30.3 pg (ref 27.2–33.4)
MCHC: 34.4 g/dL (ref 32.0–36.0)
MCV: 88 fL (ref 79.3–98.0)
MONO%: 9.1 % (ref 0.0–14.0)
NEUT%: 62 % (ref 39.0–75.0)
Platelets: 153 10*3/uL (ref 140–400)

## 2012-12-28 LAB — IRON AND TIBC
Iron: 54 ug/dL (ref 42–165)
UIBC: 287 ug/dL (ref 125–400)

## 2012-12-28 LAB — BASIC METABOLIC PANEL (CC13)
BUN: 19.4 mg/dL (ref 7.0–26.0)
Calcium: 8.9 mg/dL (ref 8.4–10.4)
Chloride: 103 mEq/L (ref 98–107)
Creatinine: 0.9 mg/dL (ref 0.7–1.3)

## 2012-12-28 LAB — FERRITIN: Ferritin: 49 ng/mL (ref 22–322)

## 2013-01-01 ENCOUNTER — Ambulatory Visit (HOSPITAL_BASED_OUTPATIENT_CLINIC_OR_DEPARTMENT_OTHER): Payer: Medicare Other | Admitting: Nurse Practitioner

## 2013-01-01 ENCOUNTER — Ambulatory Visit: Payer: Medicare Other | Admitting: Hematology and Oncology

## 2013-01-01 VITALS — BP 125/78 | HR 70 | Temp 97.6°F | Resp 20 | Ht 75.0 in | Wt 256.0 lb

## 2013-01-01 DIAGNOSIS — E119 Type 2 diabetes mellitus without complications: Secondary | ICD-10-CM

## 2013-01-01 DIAGNOSIS — Z9884 Bariatric surgery status: Secondary | ICD-10-CM

## 2013-01-01 DIAGNOSIS — D539 Nutritional anemia, unspecified: Secondary | ICD-10-CM

## 2013-01-01 DIAGNOSIS — Z862 Personal history of diseases of the blood and blood-forming organs and certain disorders involving the immune mechanism: Secondary | ICD-10-CM | POA: Diagnosis not present

## 2013-01-01 NOTE — Progress Notes (Signed)
OFFICE PROGRESS NOTE  Interval history:  Tracy Odom is a 67 year old man status post gastric bypass surgery in 2005 previously followed by Dr. Dalene Carrow for iron deficiency anemia related to a gastrointestinal bleed with ulceration occurring January 2013. He required massive transfusion support. He was ultimately found to have an ulcer at the G-J anastomosis. EGD on 03/21/2012 showed a healed anastomotic ulceration.   Labs on a 01/22/2012 returned with a ferritin of 5, hemoglobin 10.7 and MCV 80.7. He received Feraheme 1020 mg on 02/02/2012. Repeat ferritin on 02/14/2012 returned at 350. Followup labs on 04/13/2012 returned with a hemoglobin of 15.3; on 09/03/2012 the hemoglobin returned at 15.4 and ferritin 76.  His primary care provider is Dr. Valentina Lucks and his gastroenterologist is Dr. Rhea Belton.   He has diabetes mellitus. He underwent a thyroidectomy for cancer at age 24. He thinks he may have had an MI in the remote past.  Medications were reviewed.  No known drug allergies.  No family history of anemia or malignancy.  He lives in [place]. He is married. No children. He is retired. He previously worked as a Orthoptist. He quit smoking in the 1980s. He denies alcohol use.  He reports he is a vegetarian.  He overall feels well. Energy level is good. He denies shortness of breath. No bleeding.   Objective: Blood pressure 125/78, pulse 70, temperature 97.6 F (36.4 C), temperature source Oral, resp. rate 20, height 6\' 3"  (1.905 m), weight 256 lb (116.121 kg).  Oropharynx is without thrush or ulceration. No palpable cervical or supraclavicular lymph nodes. Lungs are clear. No wheezes or rales. Regular cardiac rhythm. Abdomen is soft and nontender. No organomegaly. Extremities are without edema.  Lab Results: Lab Results  Component Value Date   WBC 8.1 12/28/2012   HGB 14.4 12/28/2012   HCT 41.9 12/28/2012   MCV 88.0 12/28/2012   PLT 153 12/28/2012    Chemistry:    Chemistry      Component Value Date/Time   NA 139 12/28/2012 1335   NA 138 04/13/2012 0854   K 4.0 12/28/2012 1335   K 3.9 04/13/2012 0854   CL 103 12/28/2012 1335   CL 102 04/13/2012 0854   CO2 25 12/28/2012 1335   CO2 28 04/13/2012 0854   BUN 19.4 12/28/2012 1335   BUN 16 04/13/2012 0854   CREATININE 0.9 12/28/2012 1335   CREATININE 0.87 04/13/2012 0854      Component Value Date/Time   CALCIUM 8.9 12/28/2012 1335   CALCIUM 9.0 04/13/2012 0854   ALKPHOS 83 01/26/2012 1046   AST 18 01/26/2012 1046   ALT 25 01/26/2012 1046   BILITOT 0.3 01/26/2012 1046       Studies/Results: No results found.  Medications: I have reviewed the patient's current medications.  Assessment/Plan:  1. History of anemia due to acute blood loss January 2013 and iron deficiency. He is status post IV iron 02/02/2012 with subsequent normalization of the hemoglobin. 2. Status post gastric bypass surgery 2005. 3. History of ulceration at the G-J anastomosis January/February 2013. EGD 03/21/2012 showed the anastomotic ulceration was healed. 4. Diabetes mellitus. 5. Remote thyroidectomy for cancer.  Disposition-Tracy Odom's hemoglobin remains in normal range following the dose of IV iron in March 2013. Dr. Truett Perna recommends ferrous sulfate 325 mg daily and CBC and ferritin checks every 4 months for the next year and then at the discretion of Dr. Valentina Lucks. We did not schedule formal followup in our office. We will be happy to see him  in the future as needed.  Patient seen with Dr. Truett Perna.  Lonna Cobb ANP/GNP-BC

## 2013-01-05 ENCOUNTER — Other Ambulatory Visit: Payer: Self-pay

## 2013-01-15 DIAGNOSIS — E119 Type 2 diabetes mellitus without complications: Secondary | ICD-10-CM | POA: Diagnosis not present

## 2013-01-15 DIAGNOSIS — Z1331 Encounter for screening for depression: Secondary | ICD-10-CM | POA: Diagnosis not present

## 2013-01-15 DIAGNOSIS — Z0000 Encounter for general adult medical examination without abnormal findings: Secondary | ICD-10-CM | POA: Diagnosis not present

## 2013-01-31 ENCOUNTER — Telehealth: Payer: Self-pay | Admitting: Oncology

## 2013-01-31 NOTE — Telephone Encounter (Signed)
Fax bl.work to Dr. Hulen Skains for the patient

## 2013-02-11 DIAGNOSIS — F329 Major depressive disorder, single episode, unspecified: Secondary | ICD-10-CM | POA: Diagnosis not present

## 2013-02-11 DIAGNOSIS — F331 Major depressive disorder, recurrent, moderate: Secondary | ICD-10-CM | POA: Diagnosis not present

## 2013-02-11 DIAGNOSIS — F411 Generalized anxiety disorder: Secondary | ICD-10-CM | POA: Diagnosis not present

## 2013-05-15 DIAGNOSIS — Z9884 Bariatric surgery status: Secondary | ICD-10-CM | POA: Diagnosis not present

## 2013-05-15 DIAGNOSIS — M549 Dorsalgia, unspecified: Secondary | ICD-10-CM | POA: Diagnosis not present

## 2013-05-15 DIAGNOSIS — F411 Generalized anxiety disorder: Secondary | ICD-10-CM | POA: Diagnosis not present

## 2013-05-15 DIAGNOSIS — E785 Hyperlipidemia, unspecified: Secondary | ICD-10-CM | POA: Diagnosis not present

## 2013-05-15 DIAGNOSIS — D509 Iron deficiency anemia, unspecified: Secondary | ICD-10-CM | POA: Diagnosis not present

## 2013-05-15 DIAGNOSIS — E119 Type 2 diabetes mellitus without complications: Secondary | ICD-10-CM | POA: Diagnosis not present

## 2013-05-15 DIAGNOSIS — N200 Calculus of kidney: Secondary | ICD-10-CM | POA: Diagnosis not present

## 2013-05-20 DIAGNOSIS — F331 Major depressive disorder, recurrent, moderate: Secondary | ICD-10-CM | POA: Diagnosis not present

## 2013-05-20 DIAGNOSIS — F411 Generalized anxiety disorder: Secondary | ICD-10-CM | POA: Diagnosis not present

## 2013-06-26 ENCOUNTER — Other Ambulatory Visit: Payer: Self-pay

## 2013-08-19 DIAGNOSIS — F331 Major depressive disorder, recurrent, moderate: Secondary | ICD-10-CM | POA: Diagnosis not present

## 2013-08-19 DIAGNOSIS — F411 Generalized anxiety disorder: Secondary | ICD-10-CM | POA: Diagnosis not present

## 2013-09-16 DIAGNOSIS — E119 Type 2 diabetes mellitus without complications: Secondary | ICD-10-CM | POA: Diagnosis not present

## 2013-09-16 DIAGNOSIS — Z23 Encounter for immunization: Secondary | ICD-10-CM | POA: Diagnosis not present

## 2013-09-16 DIAGNOSIS — Z125 Encounter for screening for malignant neoplasm of prostate: Secondary | ICD-10-CM | POA: Diagnosis not present

## 2013-09-26 ENCOUNTER — Other Ambulatory Visit: Payer: Self-pay

## 2013-10-25 ENCOUNTER — Telehealth: Payer: Self-pay | Admitting: Internal Medicine

## 2013-10-25 NOTE — Telephone Encounter (Signed)
Dr Rhea Belton, this is the complicated GI Bleed pt with a large medical hx per 01/17/12 OV. OK for him to take DOXYCYCLINE; I see no record or lab result for CDIFF? Thanks.

## 2013-10-25 NOTE — Telephone Encounter (Signed)
The patient has been notified of this information and all questions answered.

## 2013-10-25 NOTE — Telephone Encounter (Signed)
Yes, doxycycline should be fine

## 2013-11-18 DIAGNOSIS — F331 Major depressive disorder, recurrent, moderate: Secondary | ICD-10-CM | POA: Diagnosis not present

## 2013-11-18 DIAGNOSIS — F411 Generalized anxiety disorder: Secondary | ICD-10-CM | POA: Diagnosis not present

## 2014-01-13 DIAGNOSIS — E119 Type 2 diabetes mellitus without complications: Secondary | ICD-10-CM | POA: Diagnosis not present

## 2014-01-13 DIAGNOSIS — M549 Dorsalgia, unspecified: Secondary | ICD-10-CM | POA: Diagnosis not present

## 2014-01-13 DIAGNOSIS — D509 Iron deficiency anemia, unspecified: Secondary | ICD-10-CM | POA: Diagnosis not present

## 2014-01-13 DIAGNOSIS — Z23 Encounter for immunization: Secondary | ICD-10-CM | POA: Diagnosis not present

## 2014-01-20 DIAGNOSIS — F988 Other specified behavioral and emotional disorders with onset usually occurring in childhood and adolescence: Secondary | ICD-10-CM | POA: Diagnosis not present

## 2014-01-22 DIAGNOSIS — I440 Atrioventricular block, first degree: Secondary | ICD-10-CM | POA: Diagnosis not present

## 2014-01-22 DIAGNOSIS — F988 Other specified behavioral and emotional disorders with onset usually occurring in childhood and adolescence: Secondary | ICD-10-CM | POA: Diagnosis not present

## 2014-01-22 DIAGNOSIS — Z79899 Other long term (current) drug therapy: Secondary | ICD-10-CM | POA: Diagnosis not present

## 2014-03-03 DIAGNOSIS — F411 Generalized anxiety disorder: Secondary | ICD-10-CM | POA: Diagnosis not present

## 2014-03-03 DIAGNOSIS — F331 Major depressive disorder, recurrent, moderate: Secondary | ICD-10-CM | POA: Diagnosis not present

## 2014-03-03 DIAGNOSIS — F988 Other specified behavioral and emotional disorders with onset usually occurring in childhood and adolescence: Secondary | ICD-10-CM | POA: Diagnosis not present

## 2014-03-10 DIAGNOSIS — F411 Generalized anxiety disorder: Secondary | ICD-10-CM | POA: Diagnosis not present

## 2014-03-10 DIAGNOSIS — F331 Major depressive disorder, recurrent, moderate: Secondary | ICD-10-CM | POA: Diagnosis not present

## 2014-03-21 DIAGNOSIS — E119 Type 2 diabetes mellitus without complications: Secondary | ICD-10-CM | POA: Diagnosis not present

## 2014-04-28 DIAGNOSIS — F331 Major depressive disorder, recurrent, moderate: Secondary | ICD-10-CM | POA: Diagnosis not present

## 2014-04-28 DIAGNOSIS — F411 Generalized anxiety disorder: Secondary | ICD-10-CM | POA: Diagnosis not present

## 2014-04-28 DIAGNOSIS — F988 Other specified behavioral and emotional disorders with onset usually occurring in childhood and adolescence: Secondary | ICD-10-CM | POA: Diagnosis not present

## 2014-05-15 DIAGNOSIS — Z9884 Bariatric surgery status: Secondary | ICD-10-CM | POA: Diagnosis not present

## 2014-05-15 DIAGNOSIS — E119 Type 2 diabetes mellitus without complications: Secondary | ICD-10-CM | POA: Diagnosis not present

## 2014-05-15 DIAGNOSIS — N200 Calculus of kidney: Secondary | ICD-10-CM | POA: Diagnosis not present

## 2014-05-15 DIAGNOSIS — E039 Hypothyroidism, unspecified: Secondary | ICD-10-CM | POA: Diagnosis not present

## 2014-05-15 DIAGNOSIS — F988 Other specified behavioral and emotional disorders with onset usually occurring in childhood and adolescence: Secondary | ICD-10-CM | POA: Diagnosis not present

## 2014-06-21 ENCOUNTER — Encounter: Payer: Self-pay | Admitting: *Deleted

## 2014-07-21 DIAGNOSIS — F988 Other specified behavioral and emotional disorders with onset usually occurring in childhood and adolescence: Secondary | ICD-10-CM | POA: Diagnosis not present

## 2014-07-21 DIAGNOSIS — F331 Major depressive disorder, recurrent, moderate: Secondary | ICD-10-CM | POA: Diagnosis not present

## 2014-07-21 DIAGNOSIS — F411 Generalized anxiety disorder: Secondary | ICD-10-CM | POA: Diagnosis not present

## 2014-08-15 DIAGNOSIS — Z23 Encounter for immunization: Secondary | ICD-10-CM | POA: Diagnosis not present

## 2014-08-15 DIAGNOSIS — Z1331 Encounter for screening for depression: Secondary | ICD-10-CM | POA: Diagnosis not present

## 2014-08-15 DIAGNOSIS — E119 Type 2 diabetes mellitus without complications: Secondary | ICD-10-CM | POA: Diagnosis not present

## 2014-08-15 DIAGNOSIS — D509 Iron deficiency anemia, unspecified: Secondary | ICD-10-CM | POA: Diagnosis not present

## 2014-08-15 DIAGNOSIS — M549 Dorsalgia, unspecified: Secondary | ICD-10-CM | POA: Diagnosis not present

## 2014-10-20 DIAGNOSIS — F419 Anxiety disorder, unspecified: Secondary | ICD-10-CM | POA: Diagnosis not present

## 2014-10-20 DIAGNOSIS — F331 Major depressive disorder, recurrent, moderate: Secondary | ICD-10-CM | POA: Diagnosis not present

## 2014-12-01 DIAGNOSIS — E119 Type 2 diabetes mellitus without complications: Secondary | ICD-10-CM | POA: Diagnosis not present

## 2014-12-01 DIAGNOSIS — R5383 Other fatigue: Secondary | ICD-10-CM | POA: Diagnosis not present

## 2014-12-01 DIAGNOSIS — E039 Hypothyroidism, unspecified: Secondary | ICD-10-CM | POA: Diagnosis not present

## 2014-12-01 DIAGNOSIS — R0602 Shortness of breath: Secondary | ICD-10-CM | POA: Diagnosis not present

## 2014-12-01 DIAGNOSIS — D649 Anemia, unspecified: Secondary | ICD-10-CM | POA: Diagnosis not present

## 2014-12-05 ENCOUNTER — Ambulatory Visit (INDEPENDENT_AMBULATORY_CARE_PROVIDER_SITE_OTHER): Payer: Medicare Other

## 2014-12-05 VITALS — BP 137/69 | HR 72 | Resp 12

## 2014-12-05 DIAGNOSIS — L03032 Cellulitis of left toe: Secondary | ICD-10-CM | POA: Diagnosis not present

## 2014-12-05 DIAGNOSIS — M79675 Pain in left toe(s): Secondary | ICD-10-CM

## 2014-12-05 DIAGNOSIS — L600 Ingrowing nail: Secondary | ICD-10-CM | POA: Diagnosis not present

## 2014-12-05 MED ORDER — CEPHALEXIN 500 MG PO CAPS: 500.0000 mg | ORAL_CAPSULE | Freq: Three times a day (TID) | ORAL | Status: DC

## 2014-12-05 NOTE — Progress Notes (Signed)
   Subjective:    Patient ID: Tracy Odom, male    DOB: [birth year], 69 y.o.   MRN: [id number]  HPI  PT STATED  LT FOOT GREAT TOENAIL WAS INFECTED AND STILL LITTLE SORE FOR 2 WEEKS. THE TOENAIL IS GETTING BETTER BUT GET A LITTLE AGGRAVATED BY PRESSURE. TRIED VASELINE BASE (BAG BOM) AND IT HELP SOME.  Review of Systems  Musculoskeletal: Positive for back pain.  All other systems reviewed and are negative.      Objective:   Physical Exam 69 year old white male well-developed well-nourished oriented 3 presents this time with a history of a history of repeated ingrowing nails medial border left great toe. Some painful tender draining he's been applying some: Pneumonic ointments is improved but not cleared completely has had multiple episodes. Lower extremity objective findings reveal vascular status to be intact with pedal pulses palpable DP +2 over 4 PT +2 over 4 bilateral Refill time 3 seconds epicritic and proprioceptive sensations intact bilateral there is normal plantar response and DTRs. Neurologically skin color pigment normal hair growth present but diminished distally nails somewhat criptotic incurvated friable particular hallux nail medial border incurvated with granulation tissue prompt flush noted. Patient does have history of diabetes without significant Locations last A1c was 6.8 otherwise in relatively good stable health. No open wounds no ulcers no secondary infections other than paronychia of the left great toe. Orthopedic exam rectus foot type mild digital mild flexible digital contractures are noted.       Assessment & Plan:  Assessment ingrowing nail paronychia medial border left great toe. Reclamation permanent nail excision with phenol matricectomy patient understands this wished to proceed. Local block is administered Betadine prep was performed the medial border is excised phenol meniscectomy followed by alcohol wash Silvadene cream and gauze dressing applied to the left  hallux Betadine preps. Recommended Betadine or Epson salt soaks. Prescription for cephalexin is given. Recommendation for daily soaks and Tylenol or Advil as needed for pain. Reappointed in 2-3 weeks for follow-up and nail check next  Harriet Masson DPM

## 2014-12-05 NOTE — Patient Instructions (Signed)
Betadine Soak Instructions  Purchase an 8 oz. bottle of BETADINE solution (Povidone)  THE DAY AFTER THE PROCEDURE  Place 1 tablespoon of betadine solution in a quart of warm tap water.  Submerge your foot or feet with outer bandage intact for the initial soak; this will allow the bandage to become moist and wet for easy lift off.  Once you remove your bandage, continue to soak in the solution for 20 minutes.  This soak should be done twice a day.  Next, remove your foot or feet from solution, blot dry the affected area and cover.  You may use a band aid large enough to cover the area or use gauze and tape.  Apply other medications to the area as directed by the doctor such as cortisporin otic solution (ear drops) or neosporin.  IF YOUR SKIN BECOMES IRRITATED WHILE USING THESE INSTRUCTIONS, IT IS OKAY TO SWITCH TO EPSOM SALTS AND WATER OR WHITE VINEGAR AND WATER.    Recommend Tylenol or Advil as needed for pain

## 2014-12-25 ENCOUNTER — Ambulatory Visit (INDEPENDENT_AMBULATORY_CARE_PROVIDER_SITE_OTHER): Payer: Medicare Other

## 2014-12-25 DIAGNOSIS — Z09 Encounter for follow-up examination after completed treatment for conditions other than malignant neoplasm: Secondary | ICD-10-CM

## 2014-12-25 DIAGNOSIS — L03032 Cellulitis of left toe: Secondary | ICD-10-CM

## 2014-12-25 NOTE — Patient Instructions (Signed)

## 2014-12-25 NOTE — Progress Notes (Signed)
   Subjective:    Patient ID: Tracy Odom, male    DOB: October 21, 1946, 69 y.o.   MRN: [id number]  HPI LT FOOT GREAT TOENAIL IS DOING MUCH BETTER.   Review of Systems no new findings or systemic changes     Objective:   Physical Exam  No new findings or systemic changes noted patient is 3 weeks status post AP nail procedure doing well no pain no discomfort no discharge drainage slight eschar noted lateral border of the left great toe assessment good resolution following AP nail procedure discharge to an as-needed basis for postop follow-up may resume all normal bathing and hygiene without restrictions to activities are restricted this time is of ingrown no signs of infection slight erythema still present consistent with postop course      Assessment & Plan:  Good resolution of nail following AP nail procedure discharge to an as-needed basis for follow-up  Harriet Masson DPM

## 2015-01-08 DIAGNOSIS — E119 Type 2 diabetes mellitus without complications: Secondary | ICD-10-CM | POA: Diagnosis not present

## 2015-01-08 DIAGNOSIS — E039 Hypothyroidism, unspecified: Secondary | ICD-10-CM | POA: Diagnosis not present

## 2015-01-08 DIAGNOSIS — Z1389 Encounter for screening for other disorder: Secondary | ICD-10-CM | POA: Diagnosis not present

## 2015-01-08 DIAGNOSIS — Z9884 Bariatric surgery status: Secondary | ICD-10-CM | POA: Diagnosis not present

## 2015-01-08 DIAGNOSIS — Z0000 Encounter for general adult medical examination without abnormal findings: Secondary | ICD-10-CM | POA: Diagnosis not present

## 2015-01-08 DIAGNOSIS — E669 Obesity, unspecified: Secondary | ICD-10-CM | POA: Diagnosis not present

## 2015-01-08 DIAGNOSIS — Z6832 Body mass index (BMI) 32.0-32.9, adult: Secondary | ICD-10-CM | POA: Diagnosis not present

## 2015-01-19 DIAGNOSIS — F900 Attention-deficit hyperactivity disorder, predominantly inattentive type: Secondary | ICD-10-CM | POA: Diagnosis not present

## 2015-01-19 DIAGNOSIS — F331 Major depressive disorder, recurrent, moderate: Secondary | ICD-10-CM | POA: Diagnosis not present

## 2015-01-19 DIAGNOSIS — F419 Anxiety disorder, unspecified: Secondary | ICD-10-CM | POA: Diagnosis not present

## 2015-02-11 DIAGNOSIS — M4806 Spinal stenosis, lumbar region: Secondary | ICD-10-CM | POA: Diagnosis not present

## 2015-02-11 DIAGNOSIS — M5440 Lumbago with sciatica, unspecified side: Secondary | ICD-10-CM | POA: Diagnosis not present

## 2015-02-23 DIAGNOSIS — F419 Anxiety disorder, unspecified: Secondary | ICD-10-CM | POA: Diagnosis not present

## 2015-02-23 DIAGNOSIS — F331 Major depressive disorder, recurrent, moderate: Secondary | ICD-10-CM | POA: Diagnosis not present

## 2015-02-23 DIAGNOSIS — F900 Attention-deficit hyperactivity disorder, predominantly inattentive type: Secondary | ICD-10-CM | POA: Diagnosis not present

## 2015-03-23 DIAGNOSIS — F900 Attention-deficit hyperactivity disorder, predominantly inattentive type: Secondary | ICD-10-CM | POA: Diagnosis not present

## 2015-04-13 ENCOUNTER — Ambulatory Visit
Admission: RE | Admit: 2015-04-13 | Discharge: 2015-04-13 | Disposition: A | Discharge status: Home / Self Care | Payer: Medicare Other | Source: Ambulatory Visit | Attending: Nurse Practitioner | Admitting: Nurse Practitioner

## 2015-04-13 ENCOUNTER — Other Ambulatory Visit: Payer: Self-pay | Admitting: Nurse Practitioner

## 2015-04-13 DIAGNOSIS — W19XXXA Unspecified fall, initial encounter: Secondary | ICD-10-CM | POA: Diagnosis not present

## 2015-04-13 DIAGNOSIS — M25511 Pain in right shoulder: Secondary | ICD-10-CM

## 2015-04-13 DIAGNOSIS — S4992XA Unspecified injury of left shoulder and upper arm, initial encounter: Secondary | ICD-10-CM | POA: Diagnosis not present

## 2015-04-13 DIAGNOSIS — M25519 Pain in unspecified shoulder: Secondary | ICD-10-CM | POA: Diagnosis not present

## 2015-04-13 DIAGNOSIS — M25512 Pain in left shoulder: Secondary | ICD-10-CM | POA: Diagnosis not present

## 2015-04-13 DIAGNOSIS — S4991XA Unspecified injury of right shoulder and upper arm, initial encounter: Secondary | ICD-10-CM | POA: Diagnosis not present

## 2015-04-13 DIAGNOSIS — R0781 Pleurodynia: Secondary | ICD-10-CM

## 2015-04-13 DIAGNOSIS — S2242XA Multiple fractures of ribs, left side, initial encounter for closed fracture: Secondary | ICD-10-CM | POA: Diagnosis not present

## 2015-05-06 DIAGNOSIS — M5440 Lumbago with sciatica, unspecified side: Secondary | ICD-10-CM | POA: Diagnosis not present

## 2015-05-06 DIAGNOSIS — M4806 Spinal stenosis, lumbar region: Secondary | ICD-10-CM | POA: Diagnosis not present

## 2015-05-06 DIAGNOSIS — M25511 Pain in right shoulder: Secondary | ICD-10-CM | POA: Diagnosis not present

## 2015-05-11 DIAGNOSIS — F900 Attention-deficit hyperactivity disorder, predominantly inattentive type: Secondary | ICD-10-CM | POA: Diagnosis not present

## 2015-05-11 DIAGNOSIS — F3340 Major depressive disorder, recurrent, in remission, unspecified: Secondary | ICD-10-CM | POA: Diagnosis not present

## 2015-05-14 DIAGNOSIS — E119 Type 2 diabetes mellitus without complications: Secondary | ICD-10-CM | POA: Diagnosis not present

## 2015-05-18 ENCOUNTER — Other Ambulatory Visit: Payer: Self-pay

## 2015-06-17 DIAGNOSIS — M5440 Lumbago with sciatica, unspecified side: Secondary | ICD-10-CM | POA: Diagnosis not present

## 2015-06-17 DIAGNOSIS — M4806 Spinal stenosis, lumbar region: Secondary | ICD-10-CM | POA: Diagnosis not present

## 2015-06-22 DIAGNOSIS — F419 Anxiety disorder, unspecified: Secondary | ICD-10-CM | POA: Diagnosis not present

## 2015-06-22 DIAGNOSIS — F3340 Major depressive disorder, recurrent, in remission, unspecified: Secondary | ICD-10-CM | POA: Diagnosis not present

## 2015-06-22 DIAGNOSIS — F900 Attention-deficit hyperactivity disorder, predominantly inattentive type: Secondary | ICD-10-CM | POA: Diagnosis not present

## 2015-08-12 DIAGNOSIS — M6530 Trigger finger, unspecified finger: Secondary | ICD-10-CM | POA: Diagnosis not present

## 2015-09-06 IMAGING — CR DG RIBS W/ CHEST 3+V*L*
3 series · 3 of 3 positions shown · non-contrast
Comparison: Chest x-ray [DATE].

CLINICAL DATA: Pain left lower ribs after fall.

EXAM:
LEFT RIBS AND CHEST - 3+ VIEW

[w chest pa]
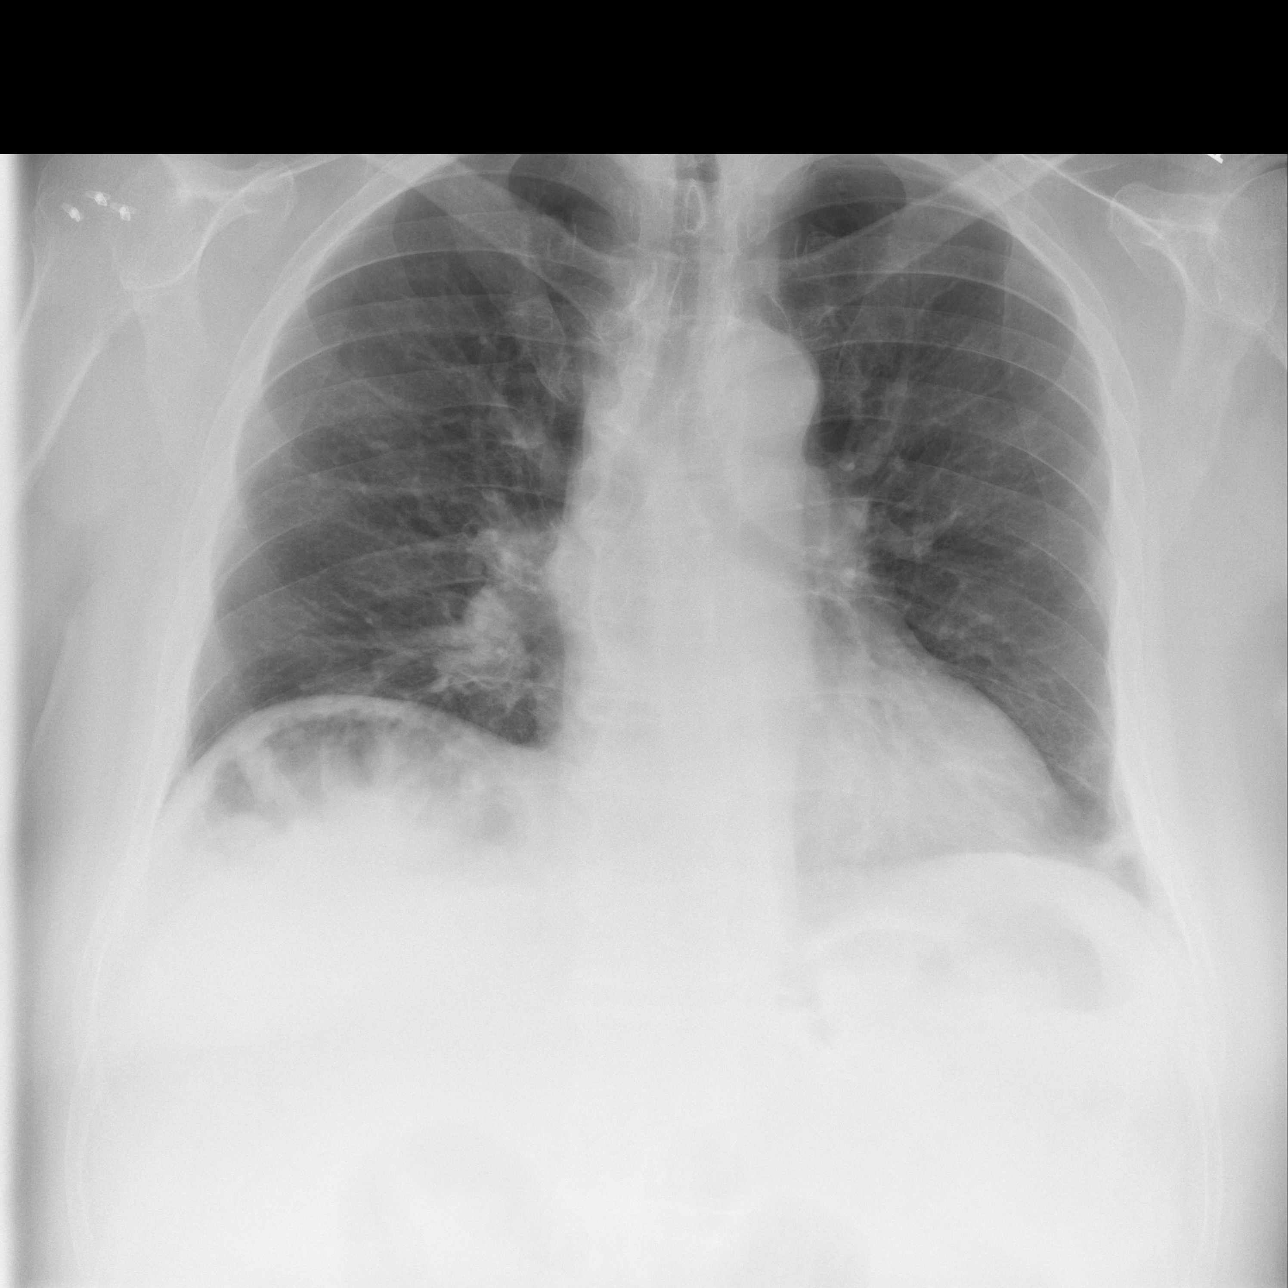

[w ribs ap/pa lower left *]
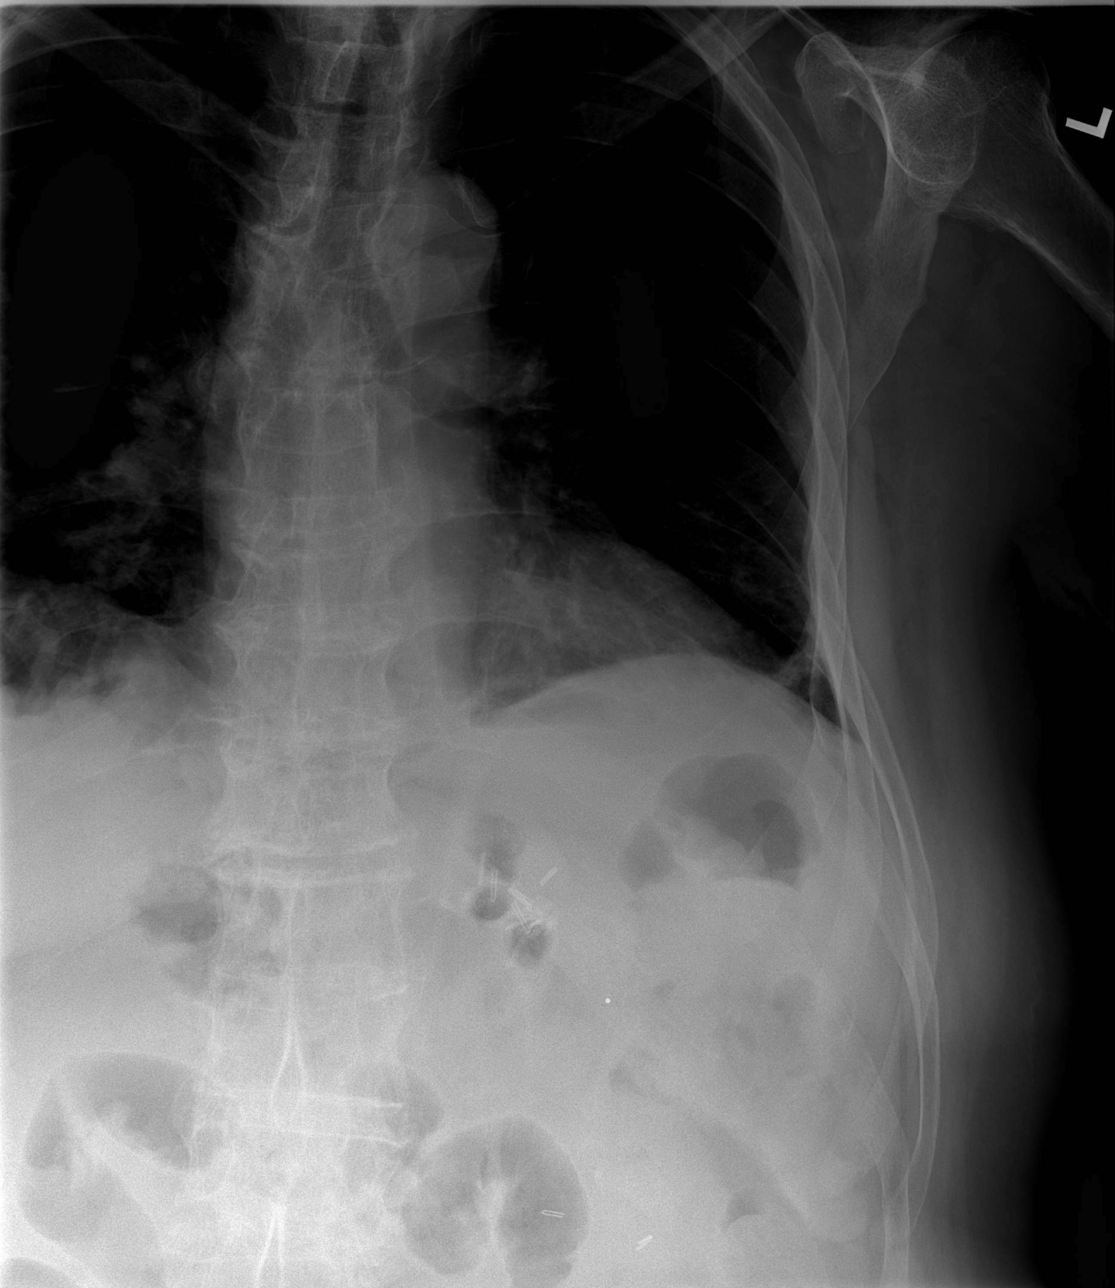

[w ribs oblique left *]
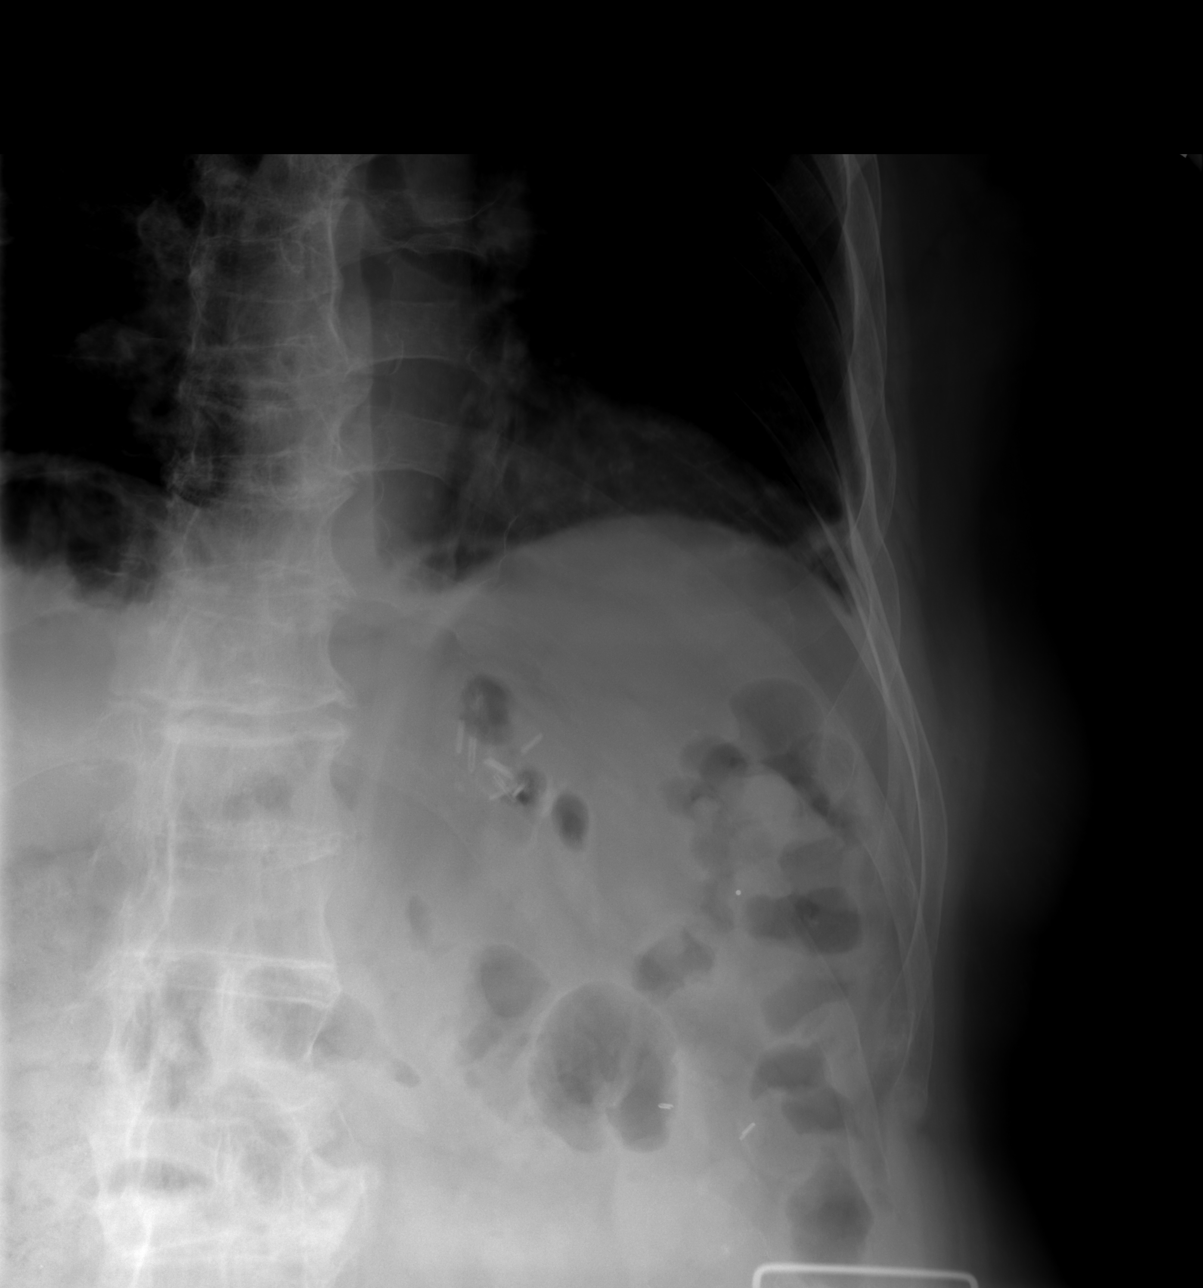

[3 of 3 positions shown; findings below may reference images not displayed]

FINDINGS: Mediastinum and hilar structures are normal. Low lung volumes with
basilar atelectasis. Stable cardiomegaly. Nondisplaced left anterior
lateral sixth seventh rib fractures are noted. No pneumothorax.
Surgical clips upper abdomen.
IMPRESSION: Nondisplaced left anterior lateral sixth and seventh rib fractures.
No pneumothorax. Basilar subsegmental atelectasis.

## 2015-09-11 DIAGNOSIS — E039 Hypothyroidism, unspecified: Secondary | ICD-10-CM | POA: Diagnosis not present

## 2015-09-11 DIAGNOSIS — Z79899 Other long term (current) drug therapy: Secondary | ICD-10-CM | POA: Diagnosis not present

## 2015-09-11 DIAGNOSIS — I252 Old myocardial infarction: Secondary | ICD-10-CM | POA: Diagnosis not present

## 2015-09-11 DIAGNOSIS — I2510 Atherosclerotic heart disease of native coronary artery without angina pectoris: Secondary | ICD-10-CM | POA: Diagnosis not present

## 2015-09-11 DIAGNOSIS — I10 Essential (primary) hypertension: Secondary | ICD-10-CM | POA: Diagnosis not present

## 2015-09-11 DIAGNOSIS — M65332 Trigger finger, left middle finger: Secondary | ICD-10-CM | POA: Diagnosis not present

## 2015-09-11 DIAGNOSIS — E119 Type 2 diabetes mellitus without complications: Secondary | ICD-10-CM | POA: Diagnosis not present

## 2015-09-14 DIAGNOSIS — F3340 Major depressive disorder, recurrent, in remission, unspecified: Secondary | ICD-10-CM | POA: Diagnosis not present

## 2015-09-14 DIAGNOSIS — F900 Attention-deficit hyperactivity disorder, predominantly inattentive type: Secondary | ICD-10-CM | POA: Diagnosis not present

## 2015-09-15 DIAGNOSIS — Z23 Encounter for immunization: Secondary | ICD-10-CM | POA: Diagnosis not present

## 2015-09-15 DIAGNOSIS — Z125 Encounter for screening for malignant neoplasm of prostate: Secondary | ICD-10-CM | POA: Diagnosis not present

## 2015-09-15 DIAGNOSIS — E119 Type 2 diabetes mellitus without complications: Secondary | ICD-10-CM | POA: Diagnosis not present

## 2015-09-16 DIAGNOSIS — M4806 Spinal stenosis, lumbar region: Secondary | ICD-10-CM | POA: Diagnosis not present

## 2015-09-16 DIAGNOSIS — M5440 Lumbago with sciatica, unspecified side: Secondary | ICD-10-CM | POA: Diagnosis not present

## 2015-12-14 DIAGNOSIS — F3340 Major depressive disorder, recurrent, in remission, unspecified: Secondary | ICD-10-CM | POA: Diagnosis not present

## 2015-12-14 DIAGNOSIS — F900 Attention-deficit hyperactivity disorder, predominantly inattentive type: Secondary | ICD-10-CM | POA: Diagnosis not present

## 2015-12-16 DIAGNOSIS — E119 Type 2 diabetes mellitus without complications: Secondary | ICD-10-CM | POA: Diagnosis not present

## 2015-12-16 DIAGNOSIS — Z7984 Long term (current) use of oral hypoglycemic drugs: Secondary | ICD-10-CM | POA: Diagnosis not present

## 2016-01-05 DIAGNOSIS — M4806 Spinal stenosis, lumbar region: Secondary | ICD-10-CM | POA: Diagnosis not present

## 2016-01-05 DIAGNOSIS — M5440 Lumbago with sciatica, unspecified side: Secondary | ICD-10-CM | POA: Diagnosis not present

## 2016-01-11 DIAGNOSIS — Z7984 Long term (current) use of oral hypoglycemic drugs: Secondary | ICD-10-CM | POA: Diagnosis not present

## 2016-01-11 DIAGNOSIS — E669 Obesity, unspecified: Secondary | ICD-10-CM | POA: Diagnosis not present

## 2016-01-11 DIAGNOSIS — Z1389 Encounter for screening for other disorder: Secondary | ICD-10-CM | POA: Diagnosis not present

## 2016-01-11 DIAGNOSIS — Z6832 Body mass index (BMI) 32.0-32.9, adult: Secondary | ICD-10-CM | POA: Diagnosis not present

## 2016-01-11 DIAGNOSIS — E119 Type 2 diabetes mellitus without complications: Secondary | ICD-10-CM | POA: Diagnosis not present

## 2016-01-11 DIAGNOSIS — F325 Major depressive disorder, single episode, in full remission: Secondary | ICD-10-CM | POA: Diagnosis not present

## 2016-01-11 DIAGNOSIS — E7800 Pure hypercholesterolemia, unspecified: Secondary | ICD-10-CM | POA: Diagnosis not present

## 2016-01-11 DIAGNOSIS — E039 Hypothyroidism, unspecified: Secondary | ICD-10-CM | POA: Diagnosis not present

## 2016-03-14 DIAGNOSIS — F419 Anxiety disorder, unspecified: Secondary | ICD-10-CM | POA: Diagnosis not present

## 2016-03-14 DIAGNOSIS — F900 Attention-deficit hyperactivity disorder, predominantly inattentive type: Secondary | ICD-10-CM | POA: Diagnosis not present

## 2016-03-14 DIAGNOSIS — F3340 Major depressive disorder, recurrent, in remission, unspecified: Secondary | ICD-10-CM | POA: Diagnosis not present

## 2016-03-30 DIAGNOSIS — M4806 Spinal stenosis, lumbar region: Secondary | ICD-10-CM | POA: Diagnosis not present

## 2016-03-30 DIAGNOSIS — M25561 Pain in right knee: Secondary | ICD-10-CM | POA: Diagnosis not present

## 2016-03-30 DIAGNOSIS — M5440 Lumbago with sciatica, unspecified side: Secondary | ICD-10-CM | POA: Diagnosis not present

## 2016-05-13 DIAGNOSIS — E119 Type 2 diabetes mellitus without complications: Secondary | ICD-10-CM | POA: Diagnosis not present

## 2016-05-13 DIAGNOSIS — M545 Low back pain: Secondary | ICD-10-CM | POA: Diagnosis not present

## 2016-05-23 DIAGNOSIS — F419 Anxiety disorder, unspecified: Secondary | ICD-10-CM | POA: Diagnosis not present

## 2016-05-23 DIAGNOSIS — F900 Attention-deficit hyperactivity disorder, predominantly inattentive type: Secondary | ICD-10-CM | POA: Diagnosis not present

## 2016-05-23 DIAGNOSIS — F3340 Major depressive disorder, recurrent, in remission, unspecified: Secondary | ICD-10-CM | POA: Diagnosis not present

## 2016-06-21 DIAGNOSIS — M4806 Spinal stenosis, lumbar region: Secondary | ICD-10-CM | POA: Diagnosis not present

## 2016-06-21 DIAGNOSIS — M5440 Lumbago with sciatica, unspecified side: Secondary | ICD-10-CM | POA: Diagnosis not present

## 2016-08-22 DIAGNOSIS — F900 Attention-deficit hyperactivity disorder, predominantly inattentive type: Secondary | ICD-10-CM | POA: Diagnosis not present

## 2016-08-22 DIAGNOSIS — Z79899 Other long term (current) drug therapy: Secondary | ICD-10-CM | POA: Diagnosis not present

## 2016-08-22 DIAGNOSIS — F419 Anxiety disorder, unspecified: Secondary | ICD-10-CM | POA: Diagnosis not present

## 2016-08-22 DIAGNOSIS — F3340 Major depressive disorder, recurrent, in remission, unspecified: Secondary | ICD-10-CM | POA: Diagnosis not present

## 2016-09-07 DIAGNOSIS — E119 Type 2 diabetes mellitus without complications: Secondary | ICD-10-CM | POA: Diagnosis not present

## 2016-09-07 DIAGNOSIS — Z7984 Long term (current) use of oral hypoglycemic drugs: Secondary | ICD-10-CM | POA: Diagnosis not present

## 2016-09-12 DIAGNOSIS — Z0000 Encounter for general adult medical examination without abnormal findings: Secondary | ICD-10-CM | POA: Diagnosis not present

## 2016-09-12 DIAGNOSIS — Z9884 Bariatric surgery status: Secondary | ICD-10-CM | POA: Diagnosis not present

## 2016-09-12 DIAGNOSIS — Z1389 Encounter for screening for other disorder: Secondary | ICD-10-CM | POA: Diagnosis not present

## 2016-09-12 DIAGNOSIS — Z125 Encounter for screening for malignant neoplasm of prostate: Secondary | ICD-10-CM | POA: Diagnosis not present

## 2016-09-12 DIAGNOSIS — Z6829 Body mass index (BMI) 29.0-29.9, adult: Secondary | ICD-10-CM | POA: Diagnosis not present

## 2016-09-12 DIAGNOSIS — N200 Calculus of kidney: Secondary | ICD-10-CM | POA: Diagnosis not present

## 2016-09-12 DIAGNOSIS — E119 Type 2 diabetes mellitus without complications: Secondary | ICD-10-CM | POA: Diagnosis not present

## 2016-09-12 DIAGNOSIS — E538 Deficiency of other specified B group vitamins: Secondary | ICD-10-CM | POA: Diagnosis not present

## 2016-09-12 DIAGNOSIS — E039 Hypothyroidism, unspecified: Secondary | ICD-10-CM | POA: Diagnosis not present

## 2016-09-12 DIAGNOSIS — F3341 Major depressive disorder, recurrent, in partial remission: Secondary | ICD-10-CM | POA: Diagnosis not present

## 2016-09-12 DIAGNOSIS — D509 Iron deficiency anemia, unspecified: Secondary | ICD-10-CM | POA: Diagnosis not present

## 2016-09-12 DIAGNOSIS — E7800 Pure hypercholesterolemia, unspecified: Secondary | ICD-10-CM | POA: Diagnosis not present

## 2016-09-12 DIAGNOSIS — E669 Obesity, unspecified: Secondary | ICD-10-CM | POA: Diagnosis not present

## 2016-09-21 DIAGNOSIS — M48062 Spinal stenosis, lumbar region with neurogenic claudication: Secondary | ICD-10-CM | POA: Diagnosis not present

## 2016-09-21 DIAGNOSIS — D509 Iron deficiency anemia, unspecified: Secondary | ICD-10-CM | POA: Diagnosis not present

## 2016-09-21 DIAGNOSIS — M5440 Lumbago with sciatica, unspecified side: Secondary | ICD-10-CM | POA: Diagnosis not present

## 2016-09-30 ENCOUNTER — Other Ambulatory Visit (HOSPITAL_COMMUNITY): Payer: Self-pay | Admitting: *Deleted

## 2016-10-03 ENCOUNTER — Ambulatory Visit (HOSPITAL_COMMUNITY)
Admission: RE | Admit: 2016-10-03 | Discharge: 2016-10-03 | Disposition: A | Discharge status: Home / Self Care | Payer: Medicare Other | Source: Ambulatory Visit | Attending: Internal Medicine | Admitting: Internal Medicine

## 2016-10-03 DIAGNOSIS — E611 Iron deficiency: Secondary | ICD-10-CM | POA: Insufficient documentation

## 2016-10-03 DIAGNOSIS — Z79899 Other long term (current) drug therapy: Secondary | ICD-10-CM | POA: Diagnosis not present

## 2016-10-03 MED ORDER — SODIUM CHLORIDE 0.9 % IV SOLN
510.0000 mg | INTRAVENOUS | Status: DC
  Administered 2016-10-03: 510 mg via INTRAVENOUS
  Filled 2016-10-03: qty 17

## 2016-10-03 NOTE — Discharge Instructions (Signed)

## 2016-10-07 ENCOUNTER — Other Ambulatory Visit (HOSPITAL_COMMUNITY): Payer: Self-pay | Admitting: *Deleted

## 2016-10-10 ENCOUNTER — Ambulatory Visit (HOSPITAL_COMMUNITY)
Admission: RE | Admit: 2016-10-10 | Discharge: 2016-10-10 | Disposition: A | Discharge status: Home / Self Care | Payer: Medicare Other | Source: Ambulatory Visit | Attending: Internal Medicine | Admitting: Internal Medicine

## 2016-10-10 DIAGNOSIS — E611 Iron deficiency: Secondary | ICD-10-CM | POA: Insufficient documentation

## 2016-10-10 MED ORDER — SODIUM CHLORIDE 0.9 % IV SOLN
510.0000 mg | INTRAVENOUS | Status: DC
  Administered 2016-10-10: 510 mg via INTRAVENOUS
  Filled 2016-10-10: qty 17

## 2016-10-10 NOTE — Progress Notes (Signed)
Tolerated Fereheme without complication.

## 2016-10-17 DIAGNOSIS — Z23 Encounter for immunization: Secondary | ICD-10-CM | POA: Diagnosis not present

## 2016-11-11 DIAGNOSIS — R9431 Abnormal electrocardiogram [ECG] [EKG]: Secondary | ICD-10-CM | POA: Diagnosis not present

## 2017-01-04 DIAGNOSIS — M5440 Lumbago with sciatica, unspecified side: Secondary | ICD-10-CM | POA: Diagnosis not present

## 2017-01-04 DIAGNOSIS — M48062 Spinal stenosis, lumbar region with neurogenic claudication: Secondary | ICD-10-CM | POA: Diagnosis not present

## 2017-01-09 DIAGNOSIS — F3342 Major depressive disorder, recurrent, in full remission: Secondary | ICD-10-CM | POA: Diagnosis not present

## 2017-01-09 DIAGNOSIS — F419 Anxiety disorder, unspecified: Secondary | ICD-10-CM | POA: Diagnosis not present

## 2017-01-09 DIAGNOSIS — F900 Attention-deficit hyperactivity disorder, predominantly inattentive type: Secondary | ICD-10-CM | POA: Diagnosis not present

## 2017-01-17 DIAGNOSIS — D126 Benign neoplasm of colon, unspecified: Secondary | ICD-10-CM | POA: Diagnosis not present

## 2017-01-17 DIAGNOSIS — E119 Type 2 diabetes mellitus without complications: Secondary | ICD-10-CM | POA: Diagnosis not present

## 2017-01-24 ENCOUNTER — Encounter: Payer: Self-pay | Admitting: Internal Medicine

## 2017-01-31 ENCOUNTER — Encounter: Payer: Self-pay | Admitting: *Deleted

## 2017-03-14 ENCOUNTER — Ambulatory Visit: Payer: Medicare Other

## 2017-03-14 VITALS — Ht 75.5 in | Wt 231.2 lb

## 2017-03-14 DIAGNOSIS — Z860100 Personal history of colon polyps, unspecified: Secondary | ICD-10-CM

## 2017-03-14 MED ORDER — SUPREP BOWEL PREP KIT 17.5-3.13-1.6 GM/177ML PO SOLN: 1.0000 | Freq: Once | ORAL | 0 refills | Status: AC

## 2017-03-14 NOTE — Progress Notes (Signed)
No diet meds No home oxygen No past problems with anesthesia No allergies to eggs or soy  Registered for emmi

## 2017-03-15 ENCOUNTER — Other Ambulatory Visit: Payer: Self-pay | Admitting: Internal Medicine

## 2017-03-20 ENCOUNTER — Telehealth: Payer: Self-pay | Admitting: Internal Medicine

## 2017-03-20 NOTE — Telephone Encounter (Signed)
Wife called.  She reported even with a discount voucher the prep will still be $100.  RN advised wife that a sample for Suprep will be signed out and placed at 4th floor receptionist desk. Angela/PV

## 2017-03-28 ENCOUNTER — Encounter: Payer: Self-pay | Admitting: Internal Medicine

## 2017-03-28 ENCOUNTER — Ambulatory Visit (AMBULATORY_SURGERY_CENTER): Payer: Medicare Other | Admitting: Internal Medicine

## 2017-03-28 VITALS — BP 159/78 | HR 55 | Temp 98.9°F | Resp 17 | Ht 75.5 in | Wt 231.0 lb

## 2017-03-28 DIAGNOSIS — D124 Benign neoplasm of descending colon: Secondary | ICD-10-CM | POA: Diagnosis not present

## 2017-03-28 DIAGNOSIS — Z86010 Personal history of colonic polyps: Secondary | ICD-10-CM | POA: Diagnosis not present

## 2017-03-28 DIAGNOSIS — D122 Benign neoplasm of ascending colon: Secondary | ICD-10-CM

## 2017-03-28 DIAGNOSIS — D123 Benign neoplasm of transverse colon: Secondary | ICD-10-CM

## 2017-03-28 DIAGNOSIS — Z1211 Encounter for screening for malignant neoplasm of colon: Secondary | ICD-10-CM | POA: Diagnosis not present

## 2017-03-28 MED ORDER — SODIUM CHLORIDE 0.9 % IV SOLN: 500.0000 mL | INTRAVENOUS | Status: AC

## 2017-03-28 NOTE — Op Note (Signed)
[facility] Patient Name: Tracy Odom Procedure Date: 03/28/2017 11:16 AM MRN: [id number] Endoscopist: Jerene Bears , MD Age: 71 Referring MD:  Date of Birth: 05-Jan-1946 Gender: Male Account #: [id number] Procedure:                Colonoscopy Indications:              Surveillance: Personal history of adenomatous                            polyps on last colonoscopy 5 years ago Medicines:                Monitored Anesthesia Care Procedure:                Pre-Anesthesia Assessment:                           - Prior to the procedure, a History and Physical                            was performed, and patient medications and                            allergies were reviewed. The patient's tolerance of                            previous anesthesia was also reviewed. The risks                            and benefits of the procedure and the sedation                            options and risks were discussed with the patient.                            All questions were answered, and informed consent                            was obtained. Prior Anticoagulants: The patient has                            taken no previous anticoagulant or antiplatelet                            agents. ASA Grade Assessment: III - A patient with                            severe systemic disease. After reviewing the risks                            and benefits, the patient was deemed in                            satisfactory condition to undergo the procedure.  After obtaining informed consent, the colonoscope                            was passed under direct vision. Throughout the                            procedure, the patient's blood pressure, pulse, and                            oxygen saturations were monitored continuously. The                            Colonoscope was introduced through the anus and                            advanced to the the cecum,  identified by                            appendiceal orifice and ileocecal valve. The                            colonoscopy was performed without difficulty. The                            patient tolerated the procedure well. The quality                            of the bowel preparation was good. The ileocecal                            valve, appendiceal orifice, and rectum were                            photographed. Scope In: 11:29:37 AM Scope Out: 11:52:52 AM Scope Withdrawal Time: 0 hours 18 minutes 40 seconds  Total Procedure Duration: 0 hours 23 minutes 15 seconds  Findings:                 The digital rectal exam was normal.                           Two sessile polyps were found in the ascending                            colon. The polyps were 5 to 7 mm in size. These                            polyps were removed with a cold snare. Resection                            and retrieval were complete.                           Four sessile polyps were found in the transverse  colon. The polyps were 4 to 6 mm in size. These                            polyps were removed with a cold snare. Resection                            and retrieval were complete.                           A 5 mm polyp was found in the descending colon. The                            polyp was sessile. The polyp was removed with a                            cold snare. Resection and retrieval were complete.                           A diffuse area of moderate melanosis was found in                            the entire colon.                           Multiple small and large-mouthed diverticula were                            found in the sigmoid colon and descending colon.                           Internal hemorrhoids were found during                            retroflexion. The hemorrhoids were small. Complications:            No immediate complications. Estimated Blood  Loss:     Estimated blood loss was minimal. Impression:               - Two 5 to 7 mm polyps in the ascending colon,                            removed with a cold snare. Resected and retrieved.                           - Four 4 to 6 mm polyps in the transverse colon,                            removed with a cold snare. Resected and retrieved.                           - One 5 mm polyp in the descending colon, removed  with a cold snare. Resected and retrieved.                           - Melanosis in the colon.                           - Moderate diverticulosis in the sigmoid colon and                            in the descending colon.                           - Internal hemorrhoids. Recommendation:           - Patient has a contact number available for                            emergencies. The signs and symptoms of potential                            delayed complications were discussed with the                            patient. Return to normal activities tomorrow.                            Written discharge instructions were provided to the                            patient.                           - Resume previous diet.                           - Continue present medications.                           - Await pathology results.                           - Repeat colonoscopy is recommended for                            surveillance. The colonoscopy date will be                            determined after pathology results from today's                            exam become available for review. Jerene Bears, MD 03/28/2017 11:57:11 AM This report has been signed electronically.

## 2017-03-28 NOTE — Patient Instructions (Signed)
YOU HAD AN ENDOSCOPIC PROCEDURE TODAY AT THE  ENDOSCOPY CENTER:   Refer to the procedure report that was given to you for any specific questions about what was found during the examination.  If the procedure report does not answer your questions, please call your gastroenterologist to clarify.  If you requested that your care partner not be given the details of your procedure findings, then the procedure report has been included in a sealed envelope for you to review at your convenience later.  YOU SHOULD EXPECT: Some feelings of bloating in the abdomen. Passage of more gas than usual.  Walking can help get rid of the air that was put into your GI tract during the procedure and reduce the bloating. If you had a lower endoscopy (such as a colonoscopy or flexible sigmoidoscopy) you may notice spotting of blood in your stool or on the toilet paper. If you underwent a bowel prep for your procedure, you may not have a normal bowel movement for a few days.  Please Note:  You might notice some irritation and congestion in your nose or some drainage.  This is from the oxygen used during your procedure.  There is no need for concern and it should clear up in a day or so.  SYMPTOMS TO REPORT IMMEDIATELY:   Following lower endoscopy (colonoscopy or flexible sigmoidoscopy):  Excessive amounts of blood in the stool  Significant tenderness or worsening of abdominal pains  Swelling of the abdomen that is new, acute  Fever of 100F or higher   For urgent or emergent issues, a gastroenterologist can be reached at any hour by calling [phone]. Please read all handouts given to you by your recovery nurse.   DIET:  We do recommend a small meal at first, but then you may proceed to your regular diet.  Drink plenty of fluids but you should avoid alcoholic beverages for 24 hours.  ACTIVITY:  You should plan to take it easy for the rest of today and you should NOT DRIVE or use heavy machinery until  tomorrow (because of the sedation medicines used during the test).    FOLLOW UP: Our staff will call the number listed on your records the next business day following your procedure to check on you and address any questions or concerns that you may have regarding the information given to you following your procedure. If we do not reach you, we will leave a message.  However, if you are feeling well and you are not experiencing any problems, there is no need to return our call.  We will assume that you have returned to your regular daily activities without incident.  If any biopsies were taken you will be contacted by phone or by letter within the next 1-3 weeks.  Please call us at [phone] if you have not heard about the biopsies in 3 weeks.    SIGNATURES/CONFIDENTIALITY: You and/or your care partner have signed paperwork which will be entered into your electronic medical record.  These signatures attest to the fact that that the information above on your After Visit Summary has been reviewed and is understood.  Full responsibility of the confidentiality of this discharge information lies with you and/or your care-partner.  Thank you for letting us take care of your healthcare needs today. 

## 2017-03-28 NOTE — Progress Notes (Signed)
To PACU VSS Report to RN 

## 2017-03-28 NOTE — Progress Notes (Signed)
Called to room to assist during endoscopic procedure.  Patient ID and intended procedure confirmed with present staff. Received instructions for my participation in the procedure from the performing physician.  

## 2017-03-29 ENCOUNTER — Telehealth: Payer: Self-pay

## 2017-03-29 DIAGNOSIS — M48062 Spinal stenosis, lumbar region with neurogenic claudication: Secondary | ICD-10-CM | POA: Diagnosis not present

## 2017-03-29 DIAGNOSIS — M5440 Lumbago with sciatica, unspecified side: Secondary | ICD-10-CM | POA: Diagnosis not present

## 2017-03-29 NOTE — Telephone Encounter (Signed)
Name identifier, left voicemail.

## 2017-03-30 ENCOUNTER — Encounter: Payer: Self-pay | Admitting: Internal Medicine

## 2017-03-30 ENCOUNTER — Telehealth: Payer: Self-pay | Admitting: *Deleted

## 2017-03-30 NOTE — Telephone Encounter (Signed)
  Follow up Call-  Call back number 03/28/2017  Post procedure Call Back phone  # [phone]  Patient states will not answer t tht time  Permission to leave phone message Yes  Some recent data might be hidden    Spoke with wife Patient questions:  Do you have a fever, pain , or abdominal swelling? No. Pain Score  0 *  Have you tolerated food without any problems? Yes.    Have you been able to return to your normal activities? Yes.    Do you have any questions about your discharge instructions: Diet   No. Medications  No. Follow up visit  No.  Do you have questions or concerns about your Care? No.  Actions: * If pain score is 4 or above: No action needed, pain <4.

## 2017-04-03 DIAGNOSIS — F3342 Major depressive disorder, recurrent, in full remission: Secondary | ICD-10-CM | POA: Diagnosis not present

## 2017-04-03 DIAGNOSIS — F900 Attention-deficit hyperactivity disorder, predominantly inattentive type: Secondary | ICD-10-CM | POA: Diagnosis not present

## 2017-04-03 DIAGNOSIS — F419 Anxiety disorder, unspecified: Secondary | ICD-10-CM | POA: Diagnosis not present

## 2017-04-18 ENCOUNTER — Other Ambulatory Visit: Payer: Self-pay | Admitting: *Deleted

## 2017-05-16 DIAGNOSIS — Z7984 Long term (current) use of oral hypoglycemic drugs: Secondary | ICD-10-CM | POA: Diagnosis not present

## 2017-05-16 DIAGNOSIS — E119 Type 2 diabetes mellitus without complications: Secondary | ICD-10-CM | POA: Diagnosis not present

## 2017-06-27 DIAGNOSIS — M5440 Lumbago with sciatica, unspecified side: Secondary | ICD-10-CM | POA: Diagnosis not present

## 2017-06-27 DIAGNOSIS — M48062 Spinal stenosis, lumbar region with neurogenic claudication: Secondary | ICD-10-CM | POA: Diagnosis not present

## 2017-07-03 DIAGNOSIS — F1011 Alcohol abuse, in remission: Secondary | ICD-10-CM | POA: Diagnosis not present

## 2017-07-03 DIAGNOSIS — F900 Attention-deficit hyperactivity disorder, predominantly inattentive type: Secondary | ICD-10-CM | POA: Diagnosis not present

## 2017-07-03 DIAGNOSIS — F419 Anxiety disorder, unspecified: Secondary | ICD-10-CM | POA: Diagnosis not present

## 2017-07-03 DIAGNOSIS — F3342 Major depressive disorder, recurrent, in full remission: Secondary | ICD-10-CM | POA: Diagnosis not present

## 2017-09-10 DIAGNOSIS — Z23 Encounter for immunization: Secondary | ICD-10-CM | POA: Diagnosis not present

## 2017-09-19 DIAGNOSIS — E119 Type 2 diabetes mellitus without complications: Secondary | ICD-10-CM | POA: Diagnosis not present

## 2017-09-19 DIAGNOSIS — K912 Postsurgical malabsorption, not elsewhere classified: Secondary | ICD-10-CM | POA: Diagnosis not present

## 2017-09-19 DIAGNOSIS — Z1389 Encounter for screening for other disorder: Secondary | ICD-10-CM | POA: Diagnosis not present

## 2017-09-19 DIAGNOSIS — E039 Hypothyroidism, unspecified: Secondary | ICD-10-CM | POA: Diagnosis not present

## 2017-09-19 DIAGNOSIS — M25561 Pain in right knee: Secondary | ICD-10-CM | POA: Diagnosis not present

## 2017-09-19 DIAGNOSIS — N200 Calculus of kidney: Secondary | ICD-10-CM | POA: Diagnosis not present

## 2017-09-19 DIAGNOSIS — E7800 Pure hypercholesterolemia, unspecified: Secondary | ICD-10-CM | POA: Diagnosis not present

## 2017-09-19 DIAGNOSIS — Z125 Encounter for screening for malignant neoplasm of prostate: Secondary | ICD-10-CM | POA: Diagnosis not present

## 2017-09-19 DIAGNOSIS — Z9884 Bariatric surgery status: Secondary | ICD-10-CM | POA: Diagnosis not present

## 2017-09-19 DIAGNOSIS — E538 Deficiency of other specified B group vitamins: Secondary | ICD-10-CM | POA: Diagnosis not present

## 2017-09-19 DIAGNOSIS — F900 Attention-deficit hyperactivity disorder, predominantly inattentive type: Secondary | ICD-10-CM | POA: Diagnosis not present

## 2017-09-19 DIAGNOSIS — Z0000 Encounter for general adult medical examination without abnormal findings: Secondary | ICD-10-CM | POA: Diagnosis not present

## 2017-09-19 DIAGNOSIS — E669 Obesity, unspecified: Secondary | ICD-10-CM | POA: Diagnosis not present

## 2017-09-19 DIAGNOSIS — M549 Dorsalgia, unspecified: Secondary | ICD-10-CM | POA: Diagnosis not present

## 2017-09-19 DIAGNOSIS — D509 Iron deficiency anemia, unspecified: Secondary | ICD-10-CM | POA: Diagnosis not present

## 2017-09-19 DIAGNOSIS — F325 Major depressive disorder, single episode, in full remission: Secondary | ICD-10-CM | POA: Diagnosis not present

## 2017-09-27 DIAGNOSIS — M48062 Spinal stenosis, lumbar region with neurogenic claudication: Secondary | ICD-10-CM | POA: Diagnosis not present

## 2017-09-27 DIAGNOSIS — M25561 Pain in right knee: Secondary | ICD-10-CM | POA: Diagnosis not present

## 2017-10-02 DIAGNOSIS — F1011 Alcohol abuse, in remission: Secondary | ICD-10-CM | POA: Diagnosis not present

## 2017-10-02 DIAGNOSIS — F419 Anxiety disorder, unspecified: Secondary | ICD-10-CM | POA: Diagnosis not present

## 2017-10-02 DIAGNOSIS — F900 Attention-deficit hyperactivity disorder, predominantly inattentive type: Secondary | ICD-10-CM | POA: Diagnosis not present

## 2017-10-02 DIAGNOSIS — F3342 Major depressive disorder, recurrent, in full remission: Secondary | ICD-10-CM | POA: Diagnosis not present

## 2017-10-09 DIAGNOSIS — M1711 Unilateral primary osteoarthritis, right knee: Secondary | ICD-10-CM | POA: Diagnosis not present

## 2017-10-18 DIAGNOSIS — Z7984 Long term (current) use of oral hypoglycemic drugs: Secondary | ICD-10-CM | POA: Diagnosis not present

## 2017-10-18 DIAGNOSIS — F325 Major depressive disorder, single episode, in full remission: Secondary | ICD-10-CM | POA: Diagnosis not present

## 2017-10-18 DIAGNOSIS — D509 Iron deficiency anemia, unspecified: Secondary | ICD-10-CM | POA: Diagnosis not present

## 2017-10-18 DIAGNOSIS — E119 Type 2 diabetes mellitus without complications: Secondary | ICD-10-CM | POA: Diagnosis not present

## 2017-10-18 DIAGNOSIS — F3341 Major depressive disorder, recurrent, in partial remission: Secondary | ICD-10-CM | POA: Diagnosis not present

## 2017-10-18 DIAGNOSIS — E039 Hypothyroidism, unspecified: Secondary | ICD-10-CM | POA: Diagnosis not present

## 2017-10-18 DIAGNOSIS — M152 Bouchard's nodes (with arthropathy): Secondary | ICD-10-CM | POA: Diagnosis not present

## 2017-11-29 ENCOUNTER — Telehealth: Payer: Self-pay | Admitting: Internal Medicine

## 2017-11-29 NOTE — Telephone Encounter (Signed)
Spoke with the spouse of this patient. Dr Laurann Montana of [facility] is treating the patient for URI. He has prescribed Z-Pack. She is concerned of causing a GI bleed. Reassured. Discussed side effect of loose stool with some patients. She will call back PRN or to Dr Delene Ruffini office.

## 2017-12-20 DIAGNOSIS — M48062 Spinal stenosis, lumbar region with neurogenic claudication: Secondary | ICD-10-CM | POA: Diagnosis not present

## 2017-12-25 DIAGNOSIS — M1711 Unilateral primary osteoarthritis, right knee: Secondary | ICD-10-CM | POA: Diagnosis not present

## 2018-01-01 DIAGNOSIS — F1011 Alcohol abuse, in remission: Secondary | ICD-10-CM | POA: Diagnosis not present

## 2018-01-01 DIAGNOSIS — F900 Attention-deficit hyperactivity disorder, predominantly inattentive type: Secondary | ICD-10-CM | POA: Diagnosis not present

## 2018-01-01 DIAGNOSIS — F411 Generalized anxiety disorder: Secondary | ICD-10-CM | POA: Diagnosis not present

## 2018-01-01 DIAGNOSIS — F3342 Major depressive disorder, recurrent, in full remission: Secondary | ICD-10-CM | POA: Diagnosis not present

## 2018-01-24 DIAGNOSIS — J343 Hypertrophy of nasal turbinates: Secondary | ICD-10-CM | POA: Diagnosis not present

## 2018-01-24 DIAGNOSIS — J3489 Other specified disorders of nose and nasal sinuses: Secondary | ICD-10-CM | POA: Diagnosis not present

## 2018-01-24 DIAGNOSIS — G4733 Obstructive sleep apnea (adult) (pediatric): Secondary | ICD-10-CM | POA: Diagnosis not present

## 2018-01-24 DIAGNOSIS — J342 Deviated nasal septum: Secondary | ICD-10-CM | POA: Diagnosis not present

## 2018-01-25 DIAGNOSIS — F325 Major depressive disorder, single episode, in full remission: Secondary | ICD-10-CM | POA: Diagnosis not present

## 2018-01-25 DIAGNOSIS — E119 Type 2 diabetes mellitus without complications: Secondary | ICD-10-CM | POA: Diagnosis not present

## 2018-02-08 DIAGNOSIS — M1711 Unilateral primary osteoarthritis, right knee: Secondary | ICD-10-CM | POA: Diagnosis not present

## 2018-02-15 DIAGNOSIS — M950 Acquired deformity of nose: Secondary | ICD-10-CM | POA: Diagnosis not present

## 2018-02-15 DIAGNOSIS — J342 Deviated nasal septum: Secondary | ICD-10-CM | POA: Diagnosis not present

## 2018-02-15 DIAGNOSIS — J3489 Other specified disorders of nose and nasal sinuses: Secondary | ICD-10-CM | POA: Diagnosis not present

## 2018-02-19 DIAGNOSIS — F3342 Major depressive disorder, recurrent, in full remission: Secondary | ICD-10-CM | POA: Diagnosis not present

## 2018-02-19 DIAGNOSIS — F411 Generalized anxiety disorder: Secondary | ICD-10-CM | POA: Diagnosis not present

## 2018-02-19 DIAGNOSIS — F900 Attention-deficit hyperactivity disorder, predominantly inattentive type: Secondary | ICD-10-CM | POA: Diagnosis not present

## 2018-02-19 DIAGNOSIS — F1011 Alcohol abuse, in remission: Secondary | ICD-10-CM | POA: Diagnosis not present

## 2018-03-14 DIAGNOSIS — M48062 Spinal stenosis, lumbar region with neurogenic claudication: Secondary | ICD-10-CM | POA: Diagnosis not present

## 2018-03-21 DIAGNOSIS — E1136 Type 2 diabetes mellitus with diabetic cataract: Secondary | ICD-10-CM | POA: Diagnosis not present

## 2018-03-21 DIAGNOSIS — H25811 Combined forms of age-related cataract, right eye: Secondary | ICD-10-CM | POA: Diagnosis not present

## 2018-03-21 DIAGNOSIS — Z7984 Long term (current) use of oral hypoglycemic drugs: Secondary | ICD-10-CM | POA: Diagnosis not present

## 2018-03-22 DIAGNOSIS — Z961 Presence of intraocular lens: Secondary | ICD-10-CM | POA: Diagnosis not present

## 2018-03-22 DIAGNOSIS — H02834 Dermatochalasis of left upper eyelid: Secondary | ICD-10-CM | POA: Diagnosis not present

## 2018-03-22 DIAGNOSIS — Z9841 Cataract extraction status, right eye: Secondary | ICD-10-CM | POA: Diagnosis not present

## 2018-03-22 DIAGNOSIS — H353111 Nonexudative age-related macular degeneration, right eye, early dry stage: Secondary | ICD-10-CM | POA: Diagnosis not present

## 2018-03-22 DIAGNOSIS — Z7984 Long term (current) use of oral hypoglycemic drugs: Secondary | ICD-10-CM | POA: Diagnosis not present

## 2018-03-22 DIAGNOSIS — Z48810 Encounter for surgical aftercare following surgery on the sense organs: Secondary | ICD-10-CM | POA: Diagnosis not present

## 2018-03-22 DIAGNOSIS — E119 Type 2 diabetes mellitus without complications: Secondary | ICD-10-CM | POA: Diagnosis not present

## 2018-03-22 DIAGNOSIS — H02831 Dermatochalasis of right upper eyelid: Secondary | ICD-10-CM | POA: Diagnosis not present

## 2018-03-22 DIAGNOSIS — H353221 Exudative age-related macular degeneration, left eye, with active choroidal neovascularization: Secondary | ICD-10-CM | POA: Diagnosis not present

## 2018-03-22 DIAGNOSIS — Z9889 Other specified postprocedural states: Secondary | ICD-10-CM | POA: Diagnosis not present

## 2018-04-04 DIAGNOSIS — H25812 Combined forms of age-related cataract, left eye: Secondary | ICD-10-CM | POA: Diagnosis not present

## 2018-04-05 DIAGNOSIS — H353221 Exudative age-related macular degeneration, left eye, with active choroidal neovascularization: Secondary | ICD-10-CM | POA: Diagnosis not present

## 2018-04-05 DIAGNOSIS — Z9842 Cataract extraction status, left eye: Secondary | ICD-10-CM | POA: Diagnosis not present

## 2018-04-05 DIAGNOSIS — Z48810 Encounter for surgical aftercare following surgery on the sense organs: Secondary | ICD-10-CM | POA: Diagnosis not present

## 2018-04-05 DIAGNOSIS — Z9841 Cataract extraction status, right eye: Secondary | ICD-10-CM | POA: Diagnosis not present

## 2018-04-05 DIAGNOSIS — E119 Type 2 diabetes mellitus without complications: Secondary | ICD-10-CM | POA: Diagnosis not present

## 2018-04-05 DIAGNOSIS — Z961 Presence of intraocular lens: Secondary | ICD-10-CM | POA: Diagnosis not present

## 2018-04-05 DIAGNOSIS — H353111 Nonexudative age-related macular degeneration, right eye, early dry stage: Secondary | ICD-10-CM | POA: Diagnosis not present

## 2018-04-24 DIAGNOSIS — Z7984 Long term (current) use of oral hypoglycemic drugs: Secondary | ICD-10-CM | POA: Diagnosis not present

## 2018-04-24 DIAGNOSIS — Z48810 Encounter for surgical aftercare following surgery on the sense organs: Secondary | ICD-10-CM | POA: Diagnosis not present

## 2018-04-24 DIAGNOSIS — Z9841 Cataract extraction status, right eye: Secondary | ICD-10-CM | POA: Diagnosis not present

## 2018-04-24 DIAGNOSIS — H02834 Dermatochalasis of left upper eyelid: Secondary | ICD-10-CM | POA: Diagnosis not present

## 2018-04-24 DIAGNOSIS — H02831 Dermatochalasis of right upper eyelid: Secondary | ICD-10-CM | POA: Diagnosis not present

## 2018-04-24 DIAGNOSIS — Z9842 Cataract extraction status, left eye: Secondary | ICD-10-CM | POA: Diagnosis not present

## 2018-04-24 DIAGNOSIS — E119 Type 2 diabetes mellitus without complications: Secondary | ICD-10-CM | POA: Diagnosis not present

## 2018-04-24 DIAGNOSIS — Z961 Presence of intraocular lens: Secondary | ICD-10-CM | POA: Diagnosis not present

## 2018-04-24 DIAGNOSIS — H353221 Exudative age-related macular degeneration, left eye, with active choroidal neovascularization: Secondary | ICD-10-CM | POA: Diagnosis not present

## 2018-04-26 DIAGNOSIS — H353222 Exudative age-related macular degeneration, left eye, with inactive choroidal neovascularization: Secondary | ICD-10-CM | POA: Diagnosis not present

## 2018-04-26 DIAGNOSIS — H43813 Vitreous degeneration, bilateral: Secondary | ICD-10-CM | POA: Diagnosis not present

## 2018-05-03 DIAGNOSIS — Z9842 Cataract extraction status, left eye: Secondary | ICD-10-CM | POA: Diagnosis not present

## 2018-05-03 DIAGNOSIS — Z7984 Long term (current) use of oral hypoglycemic drugs: Secondary | ICD-10-CM | POA: Diagnosis not present

## 2018-05-03 DIAGNOSIS — Z48810 Encounter for surgical aftercare following surgery on the sense organs: Secondary | ICD-10-CM | POA: Diagnosis not present

## 2018-05-03 DIAGNOSIS — H353221 Exudative age-related macular degeneration, left eye, with active choroidal neovascularization: Secondary | ICD-10-CM | POA: Diagnosis not present

## 2018-05-03 DIAGNOSIS — Z9841 Cataract extraction status, right eye: Secondary | ICD-10-CM | POA: Diagnosis not present

## 2018-05-03 DIAGNOSIS — E119 Type 2 diabetes mellitus without complications: Secondary | ICD-10-CM | POA: Diagnosis not present

## 2018-05-03 DIAGNOSIS — H02831 Dermatochalasis of right upper eyelid: Secondary | ICD-10-CM | POA: Diagnosis not present

## 2018-05-03 DIAGNOSIS — H02834 Dermatochalasis of left upper eyelid: Secondary | ICD-10-CM | POA: Diagnosis not present

## 2018-05-03 DIAGNOSIS — Z961 Presence of intraocular lens: Secondary | ICD-10-CM | POA: Diagnosis not present

## 2018-05-03 DIAGNOSIS — Z79899 Other long term (current) drug therapy: Secondary | ICD-10-CM | POA: Diagnosis not present

## 2018-05-29 DIAGNOSIS — Z7984 Long term (current) use of oral hypoglycemic drugs: Secondary | ICD-10-CM | POA: Diagnosis not present

## 2018-05-29 DIAGNOSIS — E039 Hypothyroidism, unspecified: Secondary | ICD-10-CM | POA: Diagnosis not present

## 2018-05-29 DIAGNOSIS — R600 Localized edema: Secondary | ICD-10-CM | POA: Diagnosis not present

## 2018-05-29 DIAGNOSIS — F325 Major depressive disorder, single episode, in full remission: Secondary | ICD-10-CM | POA: Diagnosis not present

## 2018-05-29 DIAGNOSIS — E1169 Type 2 diabetes mellitus with other specified complication: Secondary | ICD-10-CM | POA: Diagnosis not present

## 2018-05-29 DIAGNOSIS — E1165 Type 2 diabetes mellitus with hyperglycemia: Secondary | ICD-10-CM | POA: Diagnosis not present

## 2018-06-01 DIAGNOSIS — Z01818 Encounter for other preprocedural examination: Secondary | ICD-10-CM | POA: Diagnosis not present

## 2018-06-01 DIAGNOSIS — M1711 Unilateral primary osteoarthritis, right knee: Secondary | ICD-10-CM | POA: Diagnosis not present

## 2018-06-01 DIAGNOSIS — D1723 Benign lipomatous neoplasm of skin and subcutaneous tissue of right leg: Secondary | ICD-10-CM | POA: Diagnosis not present

## 2018-06-06 DIAGNOSIS — Z79899 Other long term (current) drug therapy: Secondary | ICD-10-CM | POA: Diagnosis not present

## 2018-06-06 DIAGNOSIS — M1711 Unilateral primary osteoarthritis, right knee: Secondary | ICD-10-CM | POA: Diagnosis not present

## 2018-06-12 DIAGNOSIS — M25561 Pain in right knee: Secondary | ICD-10-CM | POA: Diagnosis not present

## 2018-06-12 DIAGNOSIS — M48062 Spinal stenosis, lumbar region with neurogenic claudication: Secondary | ICD-10-CM | POA: Diagnosis not present

## 2018-06-14 DIAGNOSIS — D509 Iron deficiency anemia, unspecified: Secondary | ICD-10-CM | POA: Diagnosis present

## 2018-06-14 DIAGNOSIS — Z87891 Personal history of nicotine dependence: Secondary | ICD-10-CM | POA: Diagnosis not present

## 2018-06-14 DIAGNOSIS — Z96651 Presence of right artificial knee joint: Secondary | ICD-10-CM | POA: Diagnosis not present

## 2018-06-14 DIAGNOSIS — F3342 Major depressive disorder, recurrent, in full remission: Secondary | ICD-10-CM | POA: Diagnosis present

## 2018-06-14 DIAGNOSIS — Z8489 Family history of other specified conditions: Secondary | ICD-10-CM | POA: Diagnosis not present

## 2018-06-14 DIAGNOSIS — Z85850 Personal history of malignant neoplasm of thyroid: Secondary | ICD-10-CM | POA: Diagnosis not present

## 2018-06-14 DIAGNOSIS — Z833 Family history of diabetes mellitus: Secondary | ICD-10-CM | POA: Diagnosis not present

## 2018-06-14 DIAGNOSIS — Z7984 Long term (current) use of oral hypoglycemic drugs: Secondary | ICD-10-CM | POA: Diagnosis not present

## 2018-06-14 DIAGNOSIS — E890 Postprocedural hypothyroidism: Secondary | ICD-10-CM | POA: Diagnosis present

## 2018-06-14 DIAGNOSIS — F419 Anxiety disorder, unspecified: Secondary | ICD-10-CM | POA: Diagnosis present

## 2018-06-14 DIAGNOSIS — G4733 Obstructive sleep apnea (adult) (pediatric): Secondary | ICD-10-CM | POA: Diagnosis present

## 2018-06-14 DIAGNOSIS — Z471 Aftercare following joint replacement surgery: Secondary | ICD-10-CM | POA: Diagnosis not present

## 2018-06-14 DIAGNOSIS — M1711 Unilateral primary osteoarthritis, right knee: Secondary | ICD-10-CM | POA: Diagnosis not present

## 2018-06-14 DIAGNOSIS — E119 Type 2 diabetes mellitus without complications: Secondary | ICD-10-CM | POA: Diagnosis present

## 2018-06-14 DIAGNOSIS — Z9884 Bariatric surgery status: Secondary | ICD-10-CM | POA: Diagnosis not present

## 2018-06-14 DIAGNOSIS — E785 Hyperlipidemia, unspecified: Secondary | ICD-10-CM | POA: Diagnosis present

## 2018-06-14 DIAGNOSIS — Z83511 Family history of glaucoma: Secondary | ICD-10-CM | POA: Diagnosis not present

## 2018-06-18 DIAGNOSIS — M1711 Unilateral primary osteoarthritis, right knee: Secondary | ICD-10-CM | POA: Diagnosis not present

## 2018-06-21 DIAGNOSIS — M7989 Other specified soft tissue disorders: Secondary | ICD-10-CM | POA: Diagnosis not present

## 2018-06-22 DIAGNOSIS — R262 Difficulty in walking, not elsewhere classified: Secondary | ICD-10-CM | POA: Diagnosis not present

## 2018-06-22 DIAGNOSIS — M25661 Stiffness of right knee, not elsewhere classified: Secondary | ICD-10-CM | POA: Diagnosis not present

## 2018-06-22 DIAGNOSIS — M1711 Unilateral primary osteoarthritis, right knee: Secondary | ICD-10-CM | POA: Diagnosis not present

## 2018-06-27 DIAGNOSIS — R262 Difficulty in walking, not elsewhere classified: Secondary | ICD-10-CM | POA: Diagnosis not present

## 2018-06-27 DIAGNOSIS — M25661 Stiffness of right knee, not elsewhere classified: Secondary | ICD-10-CM | POA: Diagnosis not present

## 2018-06-27 DIAGNOSIS — M1711 Unilateral primary osteoarthritis, right knee: Secondary | ICD-10-CM | POA: Diagnosis not present

## 2018-06-29 DIAGNOSIS — M25661 Stiffness of right knee, not elsewhere classified: Secondary | ICD-10-CM | POA: Diagnosis not present

## 2018-06-29 DIAGNOSIS — Z471 Aftercare following joint replacement surgery: Secondary | ICD-10-CM | POA: Diagnosis not present

## 2018-06-29 DIAGNOSIS — Z96651 Presence of right artificial knee joint: Secondary | ICD-10-CM | POA: Diagnosis not present

## 2018-06-29 DIAGNOSIS — M1711 Unilateral primary osteoarthritis, right knee: Secondary | ICD-10-CM | POA: Diagnosis not present

## 2018-06-29 DIAGNOSIS — R262 Difficulty in walking, not elsewhere classified: Secondary | ICD-10-CM | POA: Diagnosis not present

## 2018-07-04 DIAGNOSIS — M1711 Unilateral primary osteoarthritis, right knee: Secondary | ICD-10-CM | POA: Diagnosis not present

## 2018-07-04 DIAGNOSIS — R262 Difficulty in walking, not elsewhere classified: Secondary | ICD-10-CM | POA: Diagnosis not present

## 2018-07-04 DIAGNOSIS — M25661 Stiffness of right knee, not elsewhere classified: Secondary | ICD-10-CM | POA: Diagnosis not present

## 2018-07-06 DIAGNOSIS — M25661 Stiffness of right knee, not elsewhere classified: Secondary | ICD-10-CM | POA: Diagnosis not present

## 2018-07-06 DIAGNOSIS — R262 Difficulty in walking, not elsewhere classified: Secondary | ICD-10-CM | POA: Diagnosis not present

## 2018-07-06 DIAGNOSIS — M1711 Unilateral primary osteoarthritis, right knee: Secondary | ICD-10-CM | POA: Diagnosis not present

## 2018-07-09 DIAGNOSIS — M25661 Stiffness of right knee, not elsewhere classified: Secondary | ICD-10-CM | POA: Diagnosis not present

## 2018-07-09 DIAGNOSIS — R262 Difficulty in walking, not elsewhere classified: Secondary | ICD-10-CM | POA: Diagnosis not present

## 2018-07-09 DIAGNOSIS — M1711 Unilateral primary osteoarthritis, right knee: Secondary | ICD-10-CM | POA: Diagnosis not present

## 2018-07-11 DIAGNOSIS — M25661 Stiffness of right knee, not elsewhere classified: Secondary | ICD-10-CM | POA: Diagnosis not present

## 2018-07-11 DIAGNOSIS — R262 Difficulty in walking, not elsewhere classified: Secondary | ICD-10-CM | POA: Diagnosis not present

## 2018-07-11 DIAGNOSIS — M1711 Unilateral primary osteoarthritis, right knee: Secondary | ICD-10-CM | POA: Diagnosis not present

## 2018-07-20 DIAGNOSIS — M25661 Stiffness of right knee, not elsewhere classified: Secondary | ICD-10-CM | POA: Diagnosis not present

## 2018-07-20 DIAGNOSIS — M1711 Unilateral primary osteoarthritis, right knee: Secondary | ICD-10-CM | POA: Diagnosis not present

## 2018-07-20 DIAGNOSIS — R262 Difficulty in walking, not elsewhere classified: Secondary | ICD-10-CM | POA: Diagnosis not present

## 2018-07-27 DIAGNOSIS — M1711 Unilateral primary osteoarthritis, right knee: Secondary | ICD-10-CM | POA: Diagnosis not present

## 2018-07-27 DIAGNOSIS — M25661 Stiffness of right knee, not elsewhere classified: Secondary | ICD-10-CM | POA: Diagnosis not present

## 2018-07-27 DIAGNOSIS — R262 Difficulty in walking, not elsewhere classified: Secondary | ICD-10-CM | POA: Diagnosis not present

## 2018-08-01 DIAGNOSIS — M1711 Unilateral primary osteoarthritis, right knee: Secondary | ICD-10-CM | POA: Diagnosis not present

## 2018-08-01 DIAGNOSIS — M25661 Stiffness of right knee, not elsewhere classified: Secondary | ICD-10-CM | POA: Diagnosis not present

## 2018-08-01 DIAGNOSIS — R262 Difficulty in walking, not elsewhere classified: Secondary | ICD-10-CM | POA: Diagnosis not present

## 2018-08-02 DIAGNOSIS — Z471 Aftercare following joint replacement surgery: Secondary | ICD-10-CM | POA: Diagnosis not present

## 2018-08-02 DIAGNOSIS — Z96651 Presence of right artificial knee joint: Secondary | ICD-10-CM | POA: Diagnosis not present

## 2018-08-28 DIAGNOSIS — H43813 Vitreous degeneration, bilateral: Secondary | ICD-10-CM | POA: Diagnosis not present

## 2018-08-28 DIAGNOSIS — E079 Disorder of thyroid, unspecified: Secondary | ICD-10-CM | POA: Diagnosis not present

## 2018-08-28 DIAGNOSIS — E785 Hyperlipidemia, unspecified: Secondary | ICD-10-CM | POA: Diagnosis not present

## 2018-08-28 DIAGNOSIS — E119 Type 2 diabetes mellitus without complications: Secondary | ICD-10-CM | POA: Diagnosis not present

## 2018-08-28 DIAGNOSIS — Z23 Encounter for immunization: Secondary | ICD-10-CM | POA: Diagnosis not present

## 2018-09-05 DIAGNOSIS — M48062 Spinal stenosis, lumbar region with neurogenic claudication: Secondary | ICD-10-CM | POA: Diagnosis not present

## 2018-09-05 DIAGNOSIS — M25561 Pain in right knee: Secondary | ICD-10-CM | POA: Diagnosis not present

## 2018-09-25 DIAGNOSIS — Z96651 Presence of right artificial knee joint: Secondary | ICD-10-CM | POA: Diagnosis not present

## 2018-09-25 DIAGNOSIS — Z471 Aftercare following joint replacement surgery: Secondary | ICD-10-CM | POA: Diagnosis not present

## 2018-10-29 DIAGNOSIS — F900 Attention-deficit hyperactivity disorder, predominantly inattentive type: Secondary | ICD-10-CM | POA: Diagnosis not present

## 2018-10-29 DIAGNOSIS — F411 Generalized anxiety disorder: Secondary | ICD-10-CM | POA: Diagnosis not present

## 2018-10-29 DIAGNOSIS — F3342 Major depressive disorder, recurrent, in full remission: Secondary | ICD-10-CM | POA: Diagnosis not present

## 2018-11-05 DIAGNOSIS — F411 Generalized anxiety disorder: Secondary | ICD-10-CM | POA: Diagnosis not present

## 2018-11-05 DIAGNOSIS — M1711 Unilateral primary osteoarthritis, right knee: Secondary | ICD-10-CM | POA: Diagnosis not present

## 2018-11-05 DIAGNOSIS — F900 Attention-deficit hyperactivity disorder, predominantly inattentive type: Secondary | ICD-10-CM | POA: Diagnosis not present

## 2018-12-04 DIAGNOSIS — M48062 Spinal stenosis, lumbar region with neurogenic claudication: Secondary | ICD-10-CM | POA: Diagnosis not present

## 2018-12-06 DIAGNOSIS — L219 Seborrheic dermatitis, unspecified: Secondary | ICD-10-CM | POA: Diagnosis not present

## 2018-12-06 DIAGNOSIS — L299 Pruritus, unspecified: Secondary | ICD-10-CM | POA: Diagnosis not present

## 2018-12-26 DIAGNOSIS — Z96651 Presence of right artificial knee joint: Secondary | ICD-10-CM | POA: Diagnosis not present

## 2018-12-26 DIAGNOSIS — Z471 Aftercare following joint replacement surgery: Secondary | ICD-10-CM | POA: Diagnosis not present

## 2018-12-31 DIAGNOSIS — L729 Follicular cyst of the skin and subcutaneous tissue, unspecified: Secondary | ICD-10-CM | POA: Diagnosis not present

## 2019-01-18 DIAGNOSIS — L729 Follicular cyst of the skin and subcutaneous tissue, unspecified: Secondary | ICD-10-CM | POA: Diagnosis not present

## 2019-01-28 DIAGNOSIS — F900 Attention-deficit hyperactivity disorder, predominantly inattentive type: Secondary | ICD-10-CM | POA: Diagnosis not present

## 2019-01-28 DIAGNOSIS — F3342 Major depressive disorder, recurrent, in full remission: Secondary | ICD-10-CM | POA: Diagnosis not present

## 2019-01-28 DIAGNOSIS — F411 Generalized anxiety disorder: Secondary | ICD-10-CM | POA: Diagnosis not present

## 2019-03-05 DIAGNOSIS — M25561 Pain in right knee: Secondary | ICD-10-CM | POA: Diagnosis not present

## 2019-03-05 DIAGNOSIS — M48062 Spinal stenosis, lumbar region with neurogenic claudication: Secondary | ICD-10-CM | POA: Diagnosis not present

## 2019-06-04 DIAGNOSIS — M48062 Spinal stenosis, lumbar region with neurogenic claudication: Secondary | ICD-10-CM | POA: Diagnosis not present

## 2019-06-26 DIAGNOSIS — Z471 Aftercare following joint replacement surgery: Secondary | ICD-10-CM | POA: Diagnosis not present

## 2019-06-26 DIAGNOSIS — Z96651 Presence of right artificial knee joint: Secondary | ICD-10-CM | POA: Diagnosis not present

## 2019-07-16 DIAGNOSIS — Z23 Encounter for immunization: Secondary | ICD-10-CM | POA: Diagnosis not present

## 2019-08-28 DIAGNOSIS — M48062 Spinal stenosis, lumbar region with neurogenic claudication: Secondary | ICD-10-CM | POA: Diagnosis not present

## 2019-09-25 DIAGNOSIS — M48062 Spinal stenosis, lumbar region with neurogenic claudication: Secondary | ICD-10-CM | POA: Diagnosis not present

## 2019-09-26 DIAGNOSIS — Z0000 Encounter for general adult medical examination without abnormal findings: Secondary | ICD-10-CM | POA: Diagnosis not present

## 2019-09-26 DIAGNOSIS — E119 Type 2 diabetes mellitus without complications: Secondary | ICD-10-CM | POA: Diagnosis not present

## 2019-09-26 DIAGNOSIS — Z23 Encounter for immunization: Secondary | ICD-10-CM | POA: Diagnosis not present

## 2020-03-06 ENCOUNTER — Other Ambulatory Visit: Payer: Self-pay | Admitting: Anesthesiology

## 2020-03-06 DIAGNOSIS — M48062 Spinal stenosis, lumbar region with neurogenic claudication: Secondary | ICD-10-CM
